# Patient Record
Sex: Female | Born: 1995 | Race: Black or African American | Hispanic: No | Marital: Single | Smoking: Former smoker
Health system: Southern US, Community
[De-identification: ages and names within clinical notes are randomized; demographics above are authoritative.]

## PROBLEM LIST (undated history)

## (undated) ENCOUNTER — Emergency Department (HOSPITAL_COMMUNITY): Payer: BLUE CROSS/BLUE SHIELD | Source: Home / Self Care

## (undated) DIAGNOSIS — T7840XA Allergy, unspecified, initial encounter: Secondary | ICD-10-CM

## (undated) DIAGNOSIS — N83209 Unspecified ovarian cyst, unspecified side: Secondary | ICD-10-CM

## (undated) DIAGNOSIS — G8929 Other chronic pain: Secondary | ICD-10-CM

## (undated) DIAGNOSIS — M549 Dorsalgia, unspecified: Secondary | ICD-10-CM

## (undated) DIAGNOSIS — B009 Herpesviral infection, unspecified: Secondary | ICD-10-CM

## (undated) DIAGNOSIS — G44009 Cluster headache syndrome, unspecified, not intractable: Secondary | ICD-10-CM

## (undated) HISTORY — DX: Cluster headache syndrome, unspecified, not intractable: G44.009

## (undated) HISTORY — DX: Unspecified ovarian cyst, unspecified side: N83.209

## (undated) HISTORY — PX: DG THUMB LEFT HAND: HXRAD1658

## (undated) HISTORY — PX: TONSILLECTOMY: SUR1361

## (undated) HISTORY — DX: Herpesviral infection, unspecified: B00.9

## (undated) HISTORY — PX: ADENOIDECTOMY: SUR15

## (undated) HISTORY — DX: Allergy, unspecified, initial encounter: T78.40XA

---
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
## 2017-01-03 ENCOUNTER — Encounter: Payer: Self-pay | Admitting: Family Medicine

## 2017-01-03 ENCOUNTER — Ambulatory Visit (INDEPENDENT_AMBULATORY_CARE_PROVIDER_SITE_OTHER): Payer: BLUE CROSS/BLUE SHIELD | Admitting: Family Medicine

## 2017-01-03 VITALS — BP 120/78 | HR 84 | Temp 98.3°F | Resp 16 | Ht 72.0 in | Wt 228.0 lb

## 2017-01-03 DIAGNOSIS — G43009 Migraine without aura, not intractable, without status migrainosus: Secondary | ICD-10-CM | POA: Diagnosis not present

## 2017-01-03 DIAGNOSIS — L700 Acne vulgaris: Secondary | ICD-10-CM

## 2017-01-03 DIAGNOSIS — L74510 Primary focal hyperhidrosis, axilla: Secondary | ICD-10-CM | POA: Diagnosis not present

## 2017-01-03 MED ORDER — ALUMINUM CHLORIDE 20 % EX SOLN: Freq: Every day | CUTANEOUS | 6 refills | Status: DC

## 2017-01-03 MED ORDER — CLINDAMYCIN PHOS-BENZOYL PEROX 1-5 % EX GEL: Freq: Two times a day (BID) | CUTANEOUS | 6 refills | Status: DC

## 2017-01-03 NOTE — Progress Notes (Signed)
Chief Complaint  Patient presents with  . Follow-up    2 month   Headaches improved Acne improved drysol works for sweating Wonders about Pap testing - advised can wait until 23 Never went to PT.  Back is not hurting currently  Patient Active Problem List   Diagnosis Date Noted  . Migraine headache without aura 11/28/2016  . Chronic back pain 11/28/2016  . 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 Acne vulgaris 11/28/2016    Outpatient Encounter Prescriptions as of 01/03/2017  Medication Sig  . aluminum chloride (DRYSOL) 20 % external solution Apply topically at bedtime.  Marland Kitchen aspirin-acetaminophen-caffeine (EXCEDRIN MIGRAINE) 250-250-65 MG tablet Take 1 tablet by mouth every 6 (six) hours as needed for headache.  . clindamycin-benzoyl peroxide (BENZACLIN) gel Apply topically 2 (two) times daily.  Marland Kitchen etonogestrel (NEXPLANON) 68 MG IMPL implant 1 each by Subdermal route once.  Marland Kitchen ibuprofen (ADVIL,MOTRIN) 800 MG tablet Take 1 tablet (800 mg total) by mouth every 8 (eight) hours as needed.  . rizatriptan (MAXALT) 10 MG tablet Take 10 mg by mouth as needed for migraine. May repeat in 2 hours if needed  . topiramate (TOPAMAX) 25 MG tablet Take 100 mg by mouth daily.   . [DISCONTINUED] aluminum chloride (DRYSOL) 20 % external solution Apply topically at bedtime.  . [DISCONTINUED] clindamycin-benzoyl peroxide (BENZACLIN) gel Apply topically 2 (two) times daily.   No facility-administered encounter medications on file as of 01/03/2017.     Allergies  Allergen Reactions  . Oxycodone Other (See Comments)    Dizziness     Review of Systems  Constitutional: Negative for activity change, appetite change and unexpected weight change.  HENT: Negative for congestion, dental problem, postnasal drip and rhinorrhea.   Eyes: Negative for redness and visual disturbance.  Respiratory: Negative for cough and shortness of breath.   Cardiovascular: Negative for chest pain, palpitations and leg  swelling.  Gastrointestinal: Negative for abdominal pain, constipation and diarrhea.  Genitourinary: Negative for difficulty urinating, frequency and menstrual problem.  Musculoskeletal: Negative for arthralgias and back pain.  Neurological: Negative for dizziness and headaches.  Psychiatric/Behavioral: Negative for dysphoric mood and sleep disturbance. The patient is not nervous/anxious.     BP 120/78 (BP Location: Right Arm, Patient Position: Sitting, Cuff Size: Normal)   Pulse 84   Temp 98.3 F (36.8 C) (Temporal)   Resp 16   Ht 6' (1.829 m)   Wt 228 lb 0.6 oz (103.4 kg)   SpO2 99%   BMI 30.93 kg/m   Physical Exam  Constitutional: She is oriented to person, place, and time. She appears well-developed and well-nourished.  HENT:  Head: Normocephalic and atraumatic.  Mouth/Throat: Oropharynx is clear and moist.  Eyes: Conjunctivae are normal. Pupils are equal, round, and reactive to light.  Neck: Normal range of motion. Neck supple. No thyromegaly present.  Cardiovascular: Normal rate, regular rhythm and normal heart sounds.   Pulmonary/Chest: Effort normal and breath sounds normal. No respiratory distress.  Musculoskeletal: Normal range of motion. She exhibits no edema.  Lymphadenopathy:    She has no cervical adenopathy.  Neurological: She is alert and oriented to person, place, and time.  Gait normal  Skin: Skin is warm and dry.  Mild acne face  Psychiatric: She has a normal mood and affect. Her behavior is normal. Thought content normal.  Nursing note and vitals reviewed.   ASSESSMENT/PLAN:  1. Migraine without aura and without status migrainosus, not intractable neurology  2. Hyperhidrosis of axilla controlled  3.  Acne vulgaris improved   Patient Instructions  Continue the dry sol and the face treatment Walk every day that you are able  Need last gardasil  See me yearly Call sooner for problems    Eustace MooreYvonne Sue Victor Granados, MD

## 2017-01-03 NOTE — Patient Instructions (Signed)
Continue the dry sol and the face treatment Walk every day that you are able  Need last gardasil  See me yearly Call sooner for problems

## 2017-01-14 ENCOUNTER — Telehealth: Payer: Self-pay

## 2017-01-14 NOTE — Telephone Encounter (Signed)
lmtcb

## 2017-01-14 NOTE — Telephone Encounter (Signed)
Patient needs a PT reff for after 7/23.  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  She is asking us to re-do a reff for after 7/23.  Please call her if you do not understand.  [phone]

## 2017-03-03 ENCOUNTER — Telehealth: Payer: Self-pay | Admitting: Family Medicine

## 2017-03-03 DIAGNOSIS — M549 Dorsalgia, unspecified: Principal | ICD-10-CM

## 2017-03-03 DIAGNOSIS — G8929 Other chronic pain: Secondary | ICD-10-CM

## 2017-03-03 NOTE — Telephone Encounter (Signed)
Patient requesting another referral for lower back PT.  She was never able to go because of school.  The other referral expired.  The PT office said she just needs to get a new one and they will be able to schedule her.  We have new number and fax for [facility]   Address: [address], [place], [place] [address]  Phone: [phone] Fax [phone]

## 2017-03-03 NOTE — Telephone Encounter (Signed)
Referral signed.

## 2017-04-04 ENCOUNTER — Telehealth: Payer: Self-pay | Admitting: Family Medicine

## 2017-04-04 NOTE — Telephone Encounter (Signed)
Patient states she tried physical therapy it did not work for her so she has been [facility]. She began to tell me what the chiropractor is telling her about her condition but I asked her to fill out a medical release for and send [place] the records for you to review.  She wanted you to know this situation.

## 2017-04-04 NOTE — Telephone Encounter (Signed)
noted 

## 2017-05-04 ENCOUNTER — Encounter (HOSPITAL_COMMUNITY): Payer: Self-pay | Admitting: *Deleted

## 2017-05-04 DIAGNOSIS — Z79899 Other long term (current) drug therapy: Secondary | ICD-10-CM | POA: Diagnosis not present

## 2017-05-04 DIAGNOSIS — N9089 Other specified noninflammatory disorders of vulva and perineum: Secondary | ICD-10-CM | POA: Insufficient documentation

## 2017-05-04 DIAGNOSIS — Z202 Contact with and (suspected) exposure to infections with a predominantly sexual mode of transmission: Secondary | ICD-10-CM | POA: Insufficient documentation

## 2017-05-04 NOTE — ED Triage Notes (Signed)
Pt c/o abscess to vaginal area x 3 days and burns with urination

## 2017-05-05 ENCOUNTER — Emergency Department (HOSPITAL_COMMUNITY)
Admission: EM | Admit: 2017-05-05 | Discharge: 2017-05-05 | Disposition: A | Discharge status: Home / Self Care | Payer: BLUE CROSS/BLUE SHIELD | Attending: Emergency Medicine | Admitting: Emergency Medicine

## 2017-05-05 DIAGNOSIS — N9089 Other specified noninflammatory disorders of vulva and perineum: Secondary | ICD-10-CM | POA: Diagnosis not present

## 2017-05-05 DIAGNOSIS — Z202 Contact with and (suspected) exposure to infections with a predominantly sexual mode of transmission: Secondary | ICD-10-CM

## 2017-05-05 LAB — URINALYSIS, ROUTINE W REFLEX MICROSCOPIC
BILIRUBIN URINE: NEGATIVE
Glucose, UA: NEGATIVE mg/dL
Hgb urine dipstick: NEGATIVE
KETONES UR: NEGATIVE mg/dL
Nitrite: NEGATIVE
PROTEIN: NEGATIVE mg/dL
Specific Gravity, Urine: 1.016 (ref 1.005–1.030)
pH: 7 (ref 5.0–8.0)

## 2017-05-05 LAB — POC URINE PREG, ED: Preg Test, Ur: NEGATIVE

## 2017-05-05 MED ORDER — AZITHROMYCIN 250 MG PO TABS
1000.0000 mg | ORAL_TABLET | Freq: Once | ORAL | Status: AC
  Administered 2017-05-05: 1000 mg via ORAL
  Filled 2017-05-05: qty 4

## 2017-05-05 MED ORDER — VALACYCLOVIR HCL 1 G PO TABS: 1000.0000 mg | ORAL_TABLET | Freq: Two times a day (BID) | ORAL | 0 refills | Status: DC

## 2017-05-05 MED ORDER — CEFTRIAXONE SODIUM 250 MG IJ SOLR
250.0000 mg | Freq: Once | INTRAMUSCULAR | Status: AC
  Administered 2017-05-05: 250 mg via INTRAMUSCULAR
  Filled 2017-05-05: qty 250

## 2017-05-05 MED ORDER — LIDOCAINE HCL (PF) 1 % IJ SOLN
INTRAMUSCULAR | Status: AC
  Administered 2017-05-05: 01:00:00
  Filled 2017-05-05: qty 2

## 2017-05-05 NOTE — Discharge Instructions (Signed)
Warm sitz baths may help.  Ibuprofen 600 mg every 6-8 hrs if needed for pain.  You will be contacted for any positive test results.  Call [facility] to arrange a follow-up appt.

## 2017-05-06 LAB — HIV ANTIBODY (ROUTINE TESTING W REFLEX): HIV Screen 4th Generation wRfx: NONREACTIVE

## 2017-05-06 LAB — RPR: RPR Ser Ql: NONREACTIVE

## 2017-05-06 NOTE — ED Provider Notes (Signed)
[facility] EMERGENCY DEPARTMENT Provider Note   CSN: [id number] Arrival date & time: 05/04/17  2325     History   Chief Complaint Chief Complaint  Patient presents with  . Abscess    HPI Lauren Arnold is a 21 y.o. female.  HPI  Lauren L Hogeland is a 21 y.o. female who presents to the Emergency Department complaining of pain and a "bump" to the base of the vagina and right lower labia.  Symptoms associated with burning with urination and palpation and present for 3 days.  Admits to unprotected intercourse and oral sex recently.   Denies abdominal pain, fever, hematuria, pelvic pain.    Past Medical History:  Diagnosis Date  . Allergy   . Chronic back pain     Patient Active Problem List   Diagnosis Date Noted  . Migraine headache without aura 11/28/2016  . Chronic back pain 11/28/2016  . 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 Acne vulgaris 11/28/2016    Past Surgical History:  Procedure Laterality Date  . ADENOIDECTOMY    . DG THUMB LEFT HAND    . TONSILLECTOMY      OB History    No data available       Home Medications    Prior to Admission medications   Medication Sig Start Date End Date Taking? Authorizing Provider  aluminum chloride (DRYSOL) 20 % external solution Apply topically at bedtime. 01/03/17   Eustace Moore, MD  aspirin-acetaminophen-caffeine (EXCEDRIN MIGRAINE) [phone] MG tablet Take 1 tablet by mouth every 6 (six) hours as needed for headache.    [provider]  clindamycin-benzoyl peroxide (BENZACLIN) gel Apply topically 2 (two) times daily. 01/03/17   Eustace Moore, MD  etonogestrel (NEXPLANON) 68 MG IMPL implant 1 each by Subdermal route once.    [provider]  ibuprofen (ADVIL,MOTRIN) 800 MG tablet Take 1 tablet (800 mg total) by mouth every 8 (eight) hours as needed. 11/28/16   Eustace Moore, MD  rizatriptan (MAXALT) 10 MG tablet Take 10 mg by mouth as needed for migraine. May repeat in 2 hours  if needed    [provider]  topiramate (TOPAMAX) 25 MG tablet Take 100 mg by mouth daily.     [provider]  valACYclovir (VALTREX) 1000 MG tablet Take 1 tablet (1,000 mg total) by mouth 2 (two) times daily. 05/05/17   Pauline Aus, PA-C    Family History Family History  Problem Relation Age of Onset  . Cancer Maternal Aunt        aunt  . Cancer Maternal Grandfather     Social History Social History  Substance Use Topics  . Smoking status: Never Smoker  . Smokeless tobacco: Never Used  . Alcohol use No     Allergies   Oxycodone   Review of Systems Review of Systems  Constitutional: Negative for chills and fever.  HENT: Negative for sore throat and trouble swallowing.   Respiratory: Negative for shortness of breath.   Cardiovascular: Negative for chest pain.  Gastrointestinal: Negative for abdominal pain, nausea and vomiting.  Genitourinary: Positive for dysuria, genital sores and vaginal pain. Negative for menstrual problem, vaginal bleeding and vaginal discharge.  Musculoskeletal: Negative for arthralgias and myalgias.  Skin: Negative for rash.  Neurological: Negative for dizziness, weakness, numbness and headaches.     Physical Exam Updated Vital Signs BP (!) 150/88 (BP Location: Right Arm)   Pulse (!) 103   Temp 99 F (37.2 C) (Oral)  Resp 18   Ht 6' (1.829 m)   Wt 106.6 kg (235 lb)   SpO2 100%   BMI 31.87 kg/m   Physical Exam  Constitutional: She is oriented to person, place, and time. She appears well-developed and well-nourished. No distress.  HENT:  Head: Atraumatic.  Mouth/Throat: Oropharynx is clear and moist.  Neck: Normal range of motion.  Cardiovascular: Normal rate, normal heart sounds and intact distal pulses.   No murmur heard. Pulmonary/Chest: Effort normal and breath sounds normal. No respiratory distress.  Abdominal: Soft. She exhibits no distension and no mass. There is no tenderness. There is no guarding.    Genitourinary:  Genitourinary Comments: Exam chaperoned by nursing. Ulcerations to the lower right labia minora and at base of the vaginal opening.    Musculoskeletal: Normal range of motion.  Lymphadenopathy:    She has no cervical adenopathy.  Neurological: She is alert and oriented to person, place, and time. No sensory deficit.  Skin: Skin is warm. Capillary refill takes less than 2 seconds. No rash noted.  Nursing note and vitals reviewed.    ED Treatments / Results  Labs (all labs ordered are listed, but only abnormal results are displayed) Labs Reviewed  URINALYSIS, ROUTINE W REFLEX MICROSCOPIC - Abnormal; Notable for the following:       Result Value   APPearance HAZY (*)    Leukocytes, UA LARGE (*)    Bacteria, UA RARE (*)    Squamous Epithelial / LPF 6-30 (*)    All other components within normal limits  URINE CULTURE  HSV CULTURE AND TYPING  RPR  HIV ANTIBODY (ROUTINE TESTING)  POC URINE PREG, ED    EKG  EKG Interpretation None       Radiology No results found.  Procedures Procedures (including critical care time)  Medications Ordered in ED Medications  cefTRIAXone (ROCEPHIN) injection 250 mg (250 mg Intramuscular Given 05/05/17 0118)  azithromycin (ZITHROMAX) tablet 1,000 mg (1,000 mg Oral Given 05/05/17 0118)  lidocaine (PF) (XYLOCAINE) 1 % injection (  Given 05/05/17 0118)     Initial Impression / Assessment and Plan / ED Course  I have reviewed the triage vital signs and the nursing notes.  Pertinent labs & imaging results that were available during my care of the patient were reviewed by me and considered in my medical decision making (see chart for details).     Pt well appearing.  Abdomen soft, NT.  No concerning sx's for PID or TOA.  Cultures pending.  Will tx with IM rocephin and po zithromax here, and rx for valtrex.  Pt counseled on safe sex practices and agrees to f/u with GYN.    Final Clinical Impressions(s) / ED Diagnoses    Final diagnoses:  Possible exposure to STD    New Prescriptions Discharge Medication List as of 05/05/2017  1:29 AM    START taking these medications   Details  valACYclovir (VALTREX) 1000 MG tablet Take 1 tablet (1,000 mg total) by mouth 2 (two) times daily., Starting Mon 05/05/2017, Print         [place], [place], PA-C 05/06/17 1941    Devoria AlbeKnapp, Iva, MD 05/07/17 2300

## 2017-05-07 ENCOUNTER — Ambulatory Visit (INDEPENDENT_AMBULATORY_CARE_PROVIDER_SITE_OTHER): Payer: BLUE CROSS/BLUE SHIELD | Admitting: Family Medicine

## 2017-05-07 ENCOUNTER — Encounter: Payer: Self-pay | Admitting: Family Medicine

## 2017-05-07 VITALS — BP 110/72 | HR 72 | Temp 98.9°F | Resp 16 | Ht 72.0 in | Wt 235.1 lb

## 2017-05-07 DIAGNOSIS — A6004 Herpesviral vulvovaginitis: Secondary | ICD-10-CM

## 2017-05-07 DIAGNOSIS — Z3009 Encounter for other general counseling and advice on contraception: Secondary | ICD-10-CM | POA: Diagnosis not present

## 2017-05-07 DIAGNOSIS — L700 Acne vulgaris: Secondary | ICD-10-CM | POA: Diagnosis not present

## 2017-05-07 HISTORY — DX: Herpesviral vulvovaginitis: A60.04

## 2017-05-07 LAB — HSV CULTURE AND TYPING

## 2017-05-07 LAB — URINE CULTURE: Culture: 20000 — AB

## 2017-05-07 MED ORDER — MAGIC MOUTHWASH W/LIDOCAINE: 5.0000 mL | Freq: Four times a day (QID) | ORAL | 0 refills | Status: DC | PRN

## 2017-05-07 MED ORDER — CLINDAMYCIN PHOS-BENZOYL PEROX 1-5 % EX GEL: Freq: Two times a day (BID) | CUTANEOUS | 6 refills | Status: DC

## 2017-05-07 MED ORDER — LIDOCAINE 5 % EX OINT: 1.0000 "application " | TOPICAL_OINTMENT | CUTANEOUS | 0 refills | Status: DC | PRN

## 2017-05-07 NOTE — Progress Notes (Signed)
Chief Complaint  Patient presents with  . Follow-up    ED   Here for ER follow up Primary outbreak of painful vesicles on genitals - spreading and painful. Went to ER and cultures pending aslo has pain with swallowing due to sore throat No fever or malaise Had still urinate Is here with sig other - boyfriend of years He does not have genital lesions but has had cold sores  Patient Active Problem List   Diagnosis Date Noted  . 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 Migraine headache without aura 11/28/2016  . Chronic back pain 11/28/2016  . 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 Acne vulgaris 11/28/2016    Outpatient Encounter Prescriptions as of 05/07/2017  Medication Sig  . aluminum chloride (DRYSOL) 20 % external solution Apply topically at bedtime.  Marland Kitchen aspirin-acetaminophen-caffeine (EXCEDRIN MIGRAINE) 250-250-65 MG tablet Take 1 tablet by mouth every 6 (six) hours as needed for headache.  . clindamycin-benzoyl peroxide (BENZACLIN) gel Apply topically 2 (two) times daily.  Marland Kitchen etonogestrel (NEXPLANON) 68 MG IMPL implant 1 each by Subdermal route once.  Marland Kitchen ibuprofen (ADVIL,MOTRIN) 800 MG tablet Take 1 tablet (800 mg total) by mouth every 8 (eight) hours as needed.  . rizatriptan (MAXALT) 10 MG tablet Take 10 mg by mouth as needed for migraine. May repeat in 2 hours if needed  . topiramate (TOPAMAX) 25 MG tablet Take 100 mg by mouth daily.   . valACYclovir (VALTREX) 1000 MG tablet Take 1 tablet (1,000 mg total) by mouth 2 (two) times daily.  . [DISCONTINUED] clindamycin-benzoyl peroxide (BENZACLIN) gel Apply topically 2 (two) times daily.  Marland Kitchen lidocaine (XYLOCAINE) 5 % ointment Apply 1 application topically as needed.  . magic mouthwash w/lidocaine SOLN Take 5 mLs by mouth 4 (four) times daily as needed for mouth pain.   No facility-administered encounter medications on file as of 05/07/2017.     Allergies  Allergen Reactions  . Oxycodone Other (See  Comments)    Dizziness     Review of Systems  Constitutional: Negative for activity change, appetite change and unexpected weight change.  HENT: Positive for sore throat. Negative for congestion, dental problem, postnasal drip and rhinorrhea.   Eyes: Negative for redness and visual disturbance.  Respiratory: Negative for cough and shortness of breath.   Cardiovascular: Negative for chest pain, palpitations and leg swelling.  Gastrointestinal: Negative for abdominal pain, constipation and diarrhea.  Genitourinary: Positive for genital sores. Negative for difficulty urinating, frequency and menstrual problem.  Musculoskeletal: Negative for arthralgias and back pain.  Neurological: Negative for dizziness and headaches.  Psychiatric/Behavioral: Negative for dysphoric mood and sleep disturbance. The patient is not nervous/anxious.     BP 110/72 (BP Location: Right Arm, Patient Position: Sitting, Cuff Size: Large)   Pulse 72   Temp 98.9 F (37.2 C) (Temporal)   Resp 16   Ht 6' (1.829 m)   Wt 235 lb 1.3 oz (106.6 kg)   BMI 31.88 kg/m   Physical Exam  Constitutional: She appears well-developed and well-nourished. No distress.  HENT:  Head: Normocephalic and atraumatic.  Right Ear: External ear normal.  Left Ear: External ear normal.  Nose: Nose normal.  Mouth/Throat: Oropharynx is clear and moist.  2 shallow small ulcerations seen on soft palate, mild erythema posterior pharynx no exudate  Eyes: Pupils are equal, round, and reactive to light.  Neck: Normal range of motion.  Cardiovascular: Normal rate, regular rhythm and normal heart sounds.   Pulmonary/Chest: Effort normal and breath  sounds normal.  Genitourinary:  Genitourinary Comments: Patient declines genital exam  Lymphadenopathy:    She has cervical adenopathy.  Skin: Skin is warm and dry.  Psychiatric: She has a normal mood and affect. Her behavior is normal.    ASSESSMENT/PLAN:  1. Primary vulvovaginal herpes  simplex infection I explained to the patient by history is likely that she has herpes.  She became very tearful.  I did give her written information about herpes, transmission, and what to expect.  We discussed safe sex.  2. Acne vulgaris Refill clindamycin  3. General counselling and advice on contraception Patient request - Ambulatory referral to Gynecology   Patient Instructions  I have ordered medicine for the pain  I have refilled your face medicine  I will call you with your test result  [facility]  Call for problems      Eustace MooreYvonne Sue Nelson, MD

## 2017-05-07 NOTE — Patient Instructions (Addendum)
I have ordered medicine for the pain  I have refilled your face medicine  I will call you with your test result  [facility] place [facility]  Call for problems

## 2017-05-08 ENCOUNTER — Telehealth: Payer: Self-pay | Admitting: *Deleted

## 2017-05-08 ENCOUNTER — Telehealth: Payer: Self-pay | Admitting: Family Medicine

## 2017-05-08 ENCOUNTER — Encounter: Payer: Self-pay | Admitting: Family Medicine

## 2017-05-08 NOTE — Telephone Encounter (Signed)
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  This is the cold sore virus, but can cause genital symptoms as well.  See if she has specific questions.  I gave her written info.

## 2017-05-08 NOTE — Telephone Encounter (Signed)
Patient wants to discuss lab test results from ER, wants a better understanding of the results Cb#: [phone]

## 2017-05-08 NOTE — Telephone Encounter (Signed)
Post ED Visit - Positive Culture Follow-up  Culture report reviewed by antimicrobial stewardship pharmacist:  []  Enzo BiNathan Batchelder, Pharm.D. []  Celedonio MiyamotoJeremy Frens, [address]Pharm[address]D., BCPS AQ-ID [x]  Garvin FilaMike Maccia, Pharm.D., BCPS []  Georgina PillionElizabeth Martin, Pharm.D., BCPS []  ArenaMinh Pham, [address]Pharm[address]D., BCPS, AAHIVP []  Estella HuskMichelle Turner, Pharm.D., BCPS, AAHIVP []  Lysle Pearlachel Rumbarger, PharmD, BCPS []  Casilda Carlsaylor Stone, PharmD, BCPS []  Pollyann SamplesAndy Johnston, PharmD, BCPS  Positive urine culture Seen by PCP and no further patient follow-up is required at this time.  Virl AxeRobertson, [place] [facility] 05/08/2017, 10:55 AM

## 2017-05-08 NOTE — Telephone Encounter (Signed)
Discussed result with Damiyah

## 2017-05-15 ENCOUNTER — Other Ambulatory Visit: Payer: Self-pay | Admitting: Family Medicine

## 2017-05-15 ENCOUNTER — Telehealth: Payer: Self-pay | Admitting: *Deleted

## 2017-05-15 MED ORDER — VALACYCLOVIR HCL 1 G PO TABS: 1000.0000 mg | ORAL_TABLET | Freq: Two times a day (BID) | ORAL | 0 refills | Status: DC

## 2017-05-15 NOTE — Telephone Encounter (Signed)
valtrex sent to pharmacy.  If cannot get the Rx mouthwash can use OTC chloraseptic spray for any mouth or throat pain.

## 2017-05-15 NOTE — Telephone Encounter (Signed)
Patient called stating she needs a refill on valacyclovir, patient states she could not get the mouthwash due to insurance not covering.

## 2017-05-15 NOTE — Telephone Encounter (Signed)
Patient informed of message below, verbalized understanding.  

## 2017-05-22 ENCOUNTER — Ambulatory Visit: Payer: Self-pay | Admitting: Family Medicine

## 2017-05-23 ENCOUNTER — Ambulatory Visit (INDEPENDENT_AMBULATORY_CARE_PROVIDER_SITE_OTHER): Payer: BLUE CROSS/BLUE SHIELD | Admitting: Adult Health

## 2017-05-23 ENCOUNTER — Encounter: Payer: Self-pay | Admitting: Adult Health

## 2017-05-23 VITALS — BP 102/60 | HR 76 | Ht 72.0 in | Wt 240.0 lb

## 2017-05-23 DIAGNOSIS — Z975 Presence of (intrauterine) contraceptive device: Secondary | ICD-10-CM | POA: Diagnosis not present

## 2017-05-23 DIAGNOSIS — B009 Herpesviral infection, unspecified: Secondary | ICD-10-CM

## 2017-05-23 DIAGNOSIS — Z113 Encounter for screening for infections with a predominantly sexual mode of transmission: Secondary | ICD-10-CM | POA: Diagnosis not present

## 2017-05-23 DIAGNOSIS — Z01419 Encounter for gynecological examination (general) (routine) without abnormal findings: Secondary | ICD-10-CM | POA: Diagnosis not present

## 2017-05-23 DIAGNOSIS — Z3009 Encounter for other general counseling and advice on contraception: Secondary | ICD-10-CM | POA: Insufficient documentation

## 2017-05-23 HISTORY — DX: Encounter for other general counseling and advice on contraception: Z30.09

## 2017-05-23 MED ORDER — AMPICILLIN 500 MG PO CAPS: 500.0000 mg | ORAL_CAPSULE | Freq: Four times a day (QID) | ORAL | 0 refills | Status: DC

## 2017-05-23 NOTE — Progress Notes (Signed)
Patient ID: Lauren Arnold, female   DOB: 11/14/1995, 21 y.o.   MRN: [id number] History of Present Illness:  Lauren Arnold is a 21 year old black female in for well woman gyn exam, She recently had HSV 1.Has nexplanon in right arm, was placed October 2017.Had negative HIV and RPR in ER 05/05/17.+GBS in urine.  PCP is Dr Delton SeeNelson.   Current Medications, Allergies, Past Medical History, Past Surgical History, Family History and Social History were [facility].     Review of Systems:  Patient denies any headaches, hearing loss, fatigue, blurred vision, shortness of breath, chest pain, abdominal pain, problems with bowel movements, urination, or intercourse. No joint pain or mood swings.   Physical Exam:BP 102/60 (BP Location: Left Arm, Patient Position: Sitting, Cuff Size: Large)   Pulse 76   Ht 6' (1.829 m)   Wt 240 lb (108.9 kg)   BMI 32.55 kg/m  General:  Well developed, well nourished, no acute distress Skin:  Warm and dry Neck:  Midline trachea, normal thyroid, good ROM, no lymphadenopathy Lungs; Clear to auscultation bilaterally Breast:  No dominant palpable mass, retraction, or nipple discharge Cardiovascular: Regular rate and rhythm Abdomen:  Soft, non tender, no hepatosplenomegaly Pelvic:  External genitalia is normal in appearance, no lesions.  The vagina is normal in appearance. Urethra has no lesions or masses. The cervix is tiny and smooth.  Uterus is felt to be normal size, shape, and contour.  No adnexal masses or tenderness noted.Bladder is non tender, no masses felt.GC/CHL obtained.  Extremities/musculoskeletal:  No swelling or varicosities noted, no clubbing or cyanosis Psych:  No mood changes, alert and cooperative,seems happy PHQ 9 score 5,denies being suicidal, has seen psychiatrist and has rx but not started it yet.  Impression: 1. Encounter for well woman exam with routine gynecological exam   2. Screening examination for STD  (sexually transmitted disease)   3. Nexplanon in place       Plan: Rx ampicillin 500 mg 1 qid for 7 days GC/CHL sent Physical in 1 year Pap at 23 Review handout on herpes

## 2017-05-25 LAB — GC/CHLAMYDIA PROBE AMP
CHLAMYDIA, DNA PROBE: NEGATIVE
NEISSERIA GONORRHOEAE BY PCR: NEGATIVE

## 2017-07-21 ENCOUNTER — Telehealth: Payer: Self-pay | Admitting: Family Medicine

## 2017-07-21 NOTE — Telephone Encounter (Signed)
Cant get in to see Neuro Dr, and the Migraines are back.. And the Tramadol is making her sick, and does not work. What can she [phone],,,,[phone], about to go to class from 3 to 5 so call tomorrow

## 2017-07-22 NOTE — Telephone Encounter (Signed)
ibu 800 TID prn with food

## 2017-07-23 MED ORDER — IBUPROFEN 800 MG PO TABS: 800.0000 mg | ORAL_TABLET | Freq: Three times a day (TID) | ORAL | 0 refills | Status: DC | PRN

## 2017-07-23 NOTE — Telephone Encounter (Signed)
Done, left message for bri kell

## 2017-09-22 ENCOUNTER — Encounter: Payer: Self-pay | Admitting: Family Medicine

## 2017-12-31 IMAGING — CT CT HEAD W/O CM
3 series · 16 of 47 positions shown, 19 images · non-contrast
Comparison: None.

CLINICAL DATA: Daily headaches for month, photophobia.

EXAM:
CT HEAD WITHOUT CONTRAST
TECHNIQUE: Contiguous axial images were obtained from the base of the skull
through the vertex without intravenous contrast.

[Series 2: head wo · axial · 0.43mm/px · z∈[+117,+247]mm · 10 of 32 slices shown, 13 images]
[im 3/32  brain]
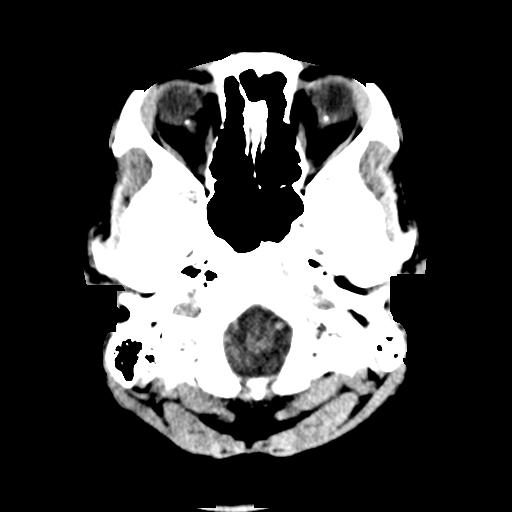
[im 3/32  bone]
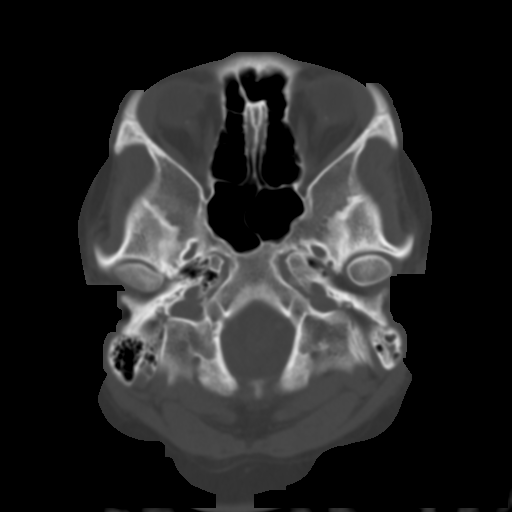
[im 6/32  brain]
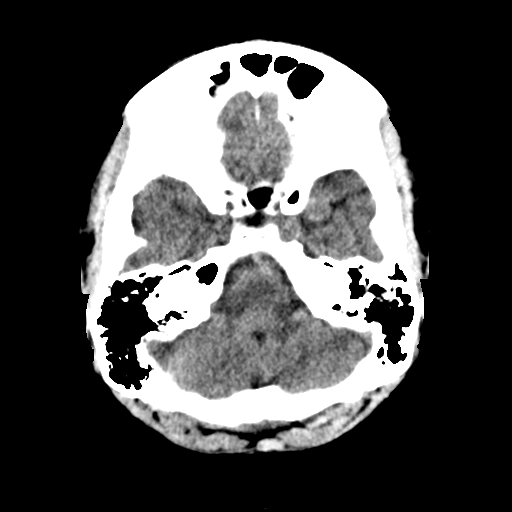
[im 9/32  brain]
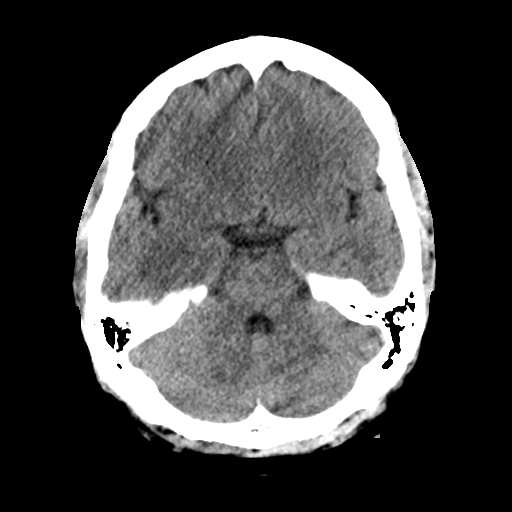
[im 11/32  brain]
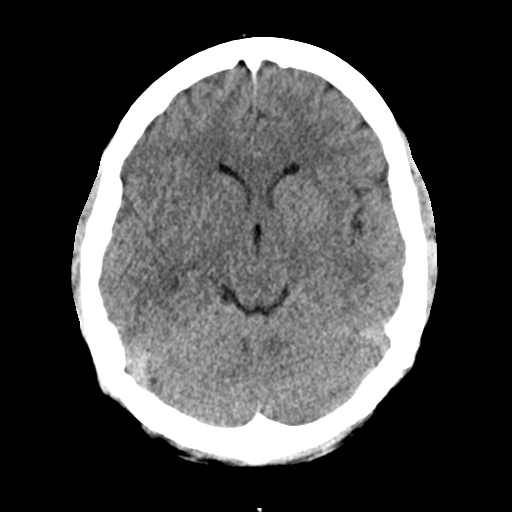
[im 14/32  brain]
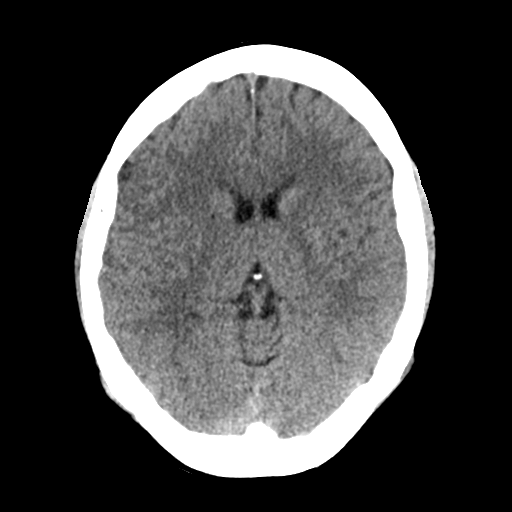
[im 14/32  bone]
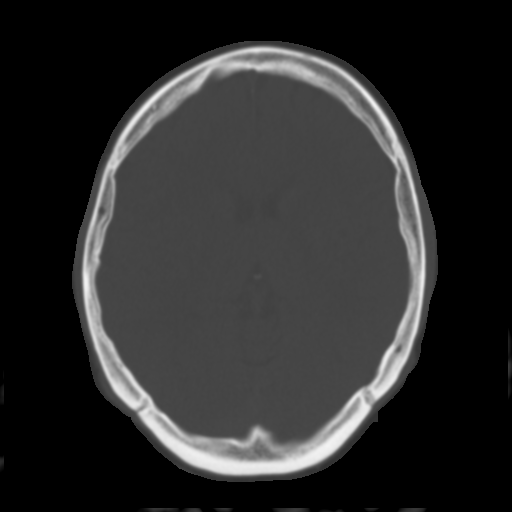
[im 18/32  brain]
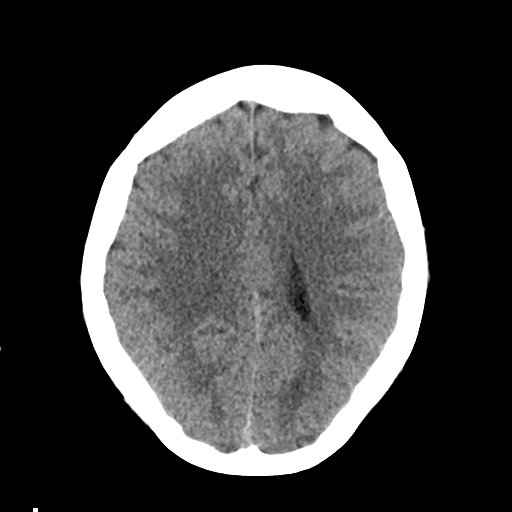
[im 21/32  brain]
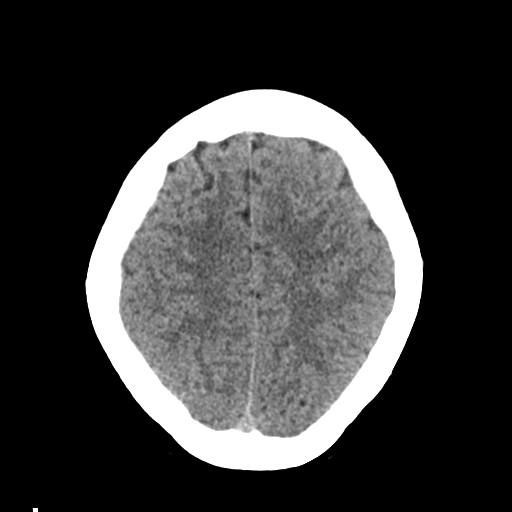
[im 24/32  brain]
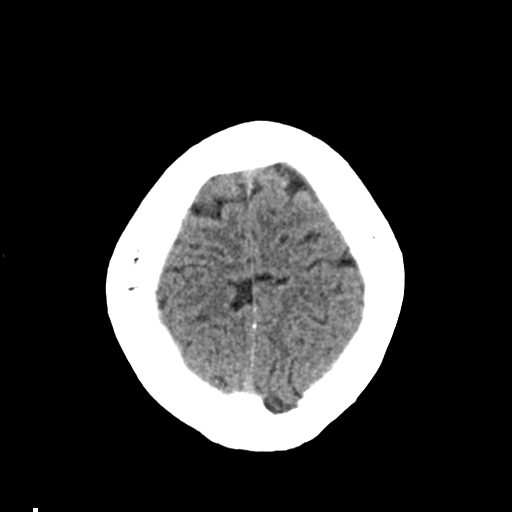
[im 26/32  brain]
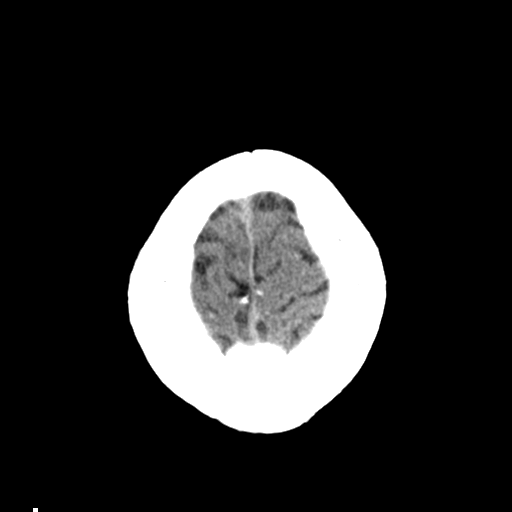
[im 26/32  bone]
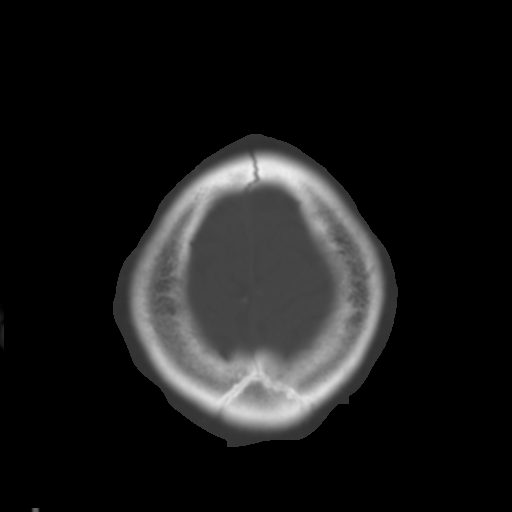
[im 29/32  brain]
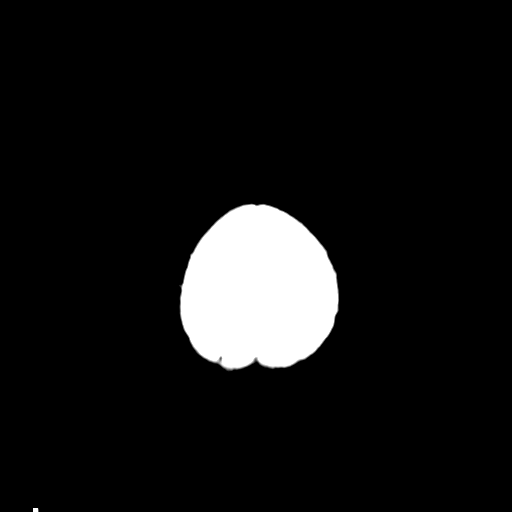

[Series 4: coronal soft tissue · coronal · 0.34mm/px · 3 of 71 slices shown]
[im 24/71  brain]
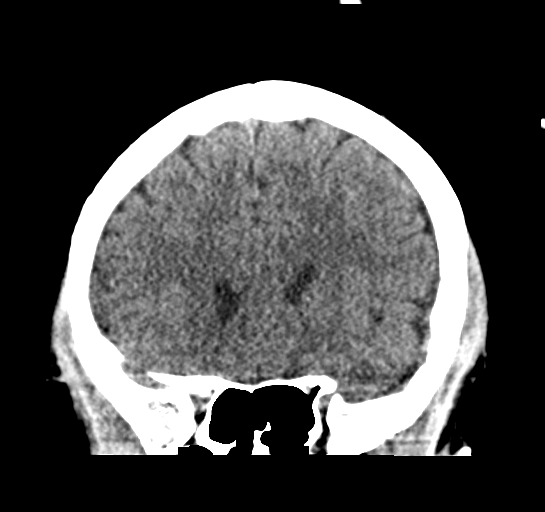
[im 32/71  brain]
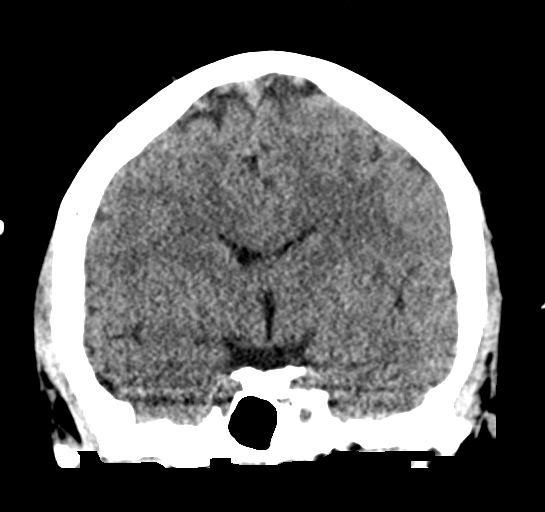
[im 39/71  brain]
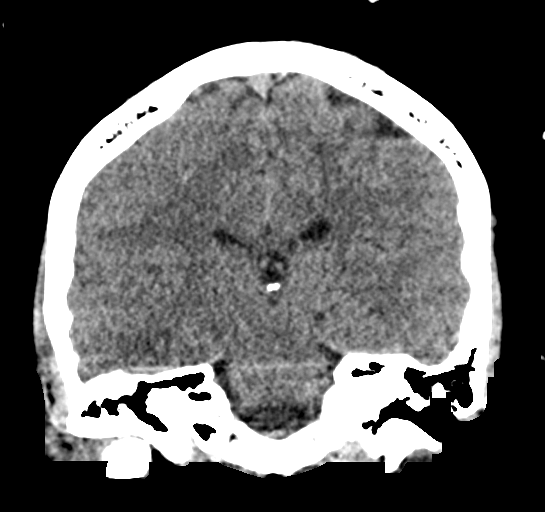

[Series 5: sagittal soft tissue · sagittal · 0.32mm/px · 3 of 58 slices shown]
[im 20/58  brain]
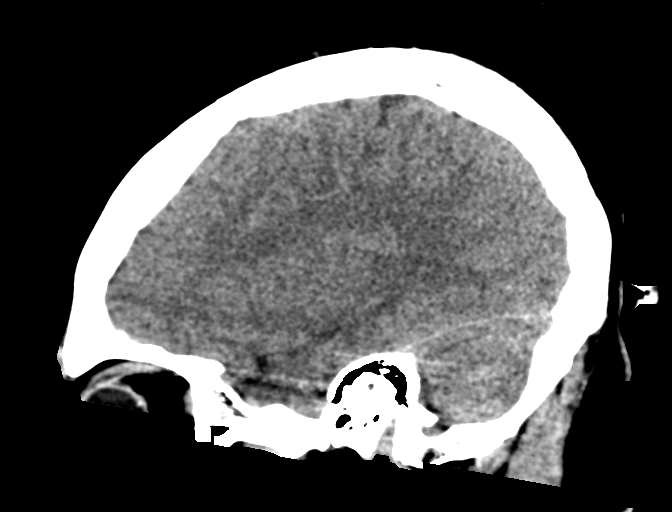
[im 29/58  brain]
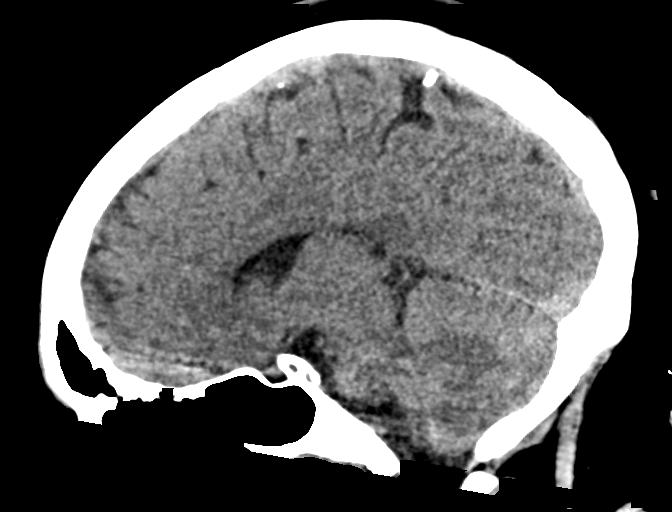
[im 39/58  brain]
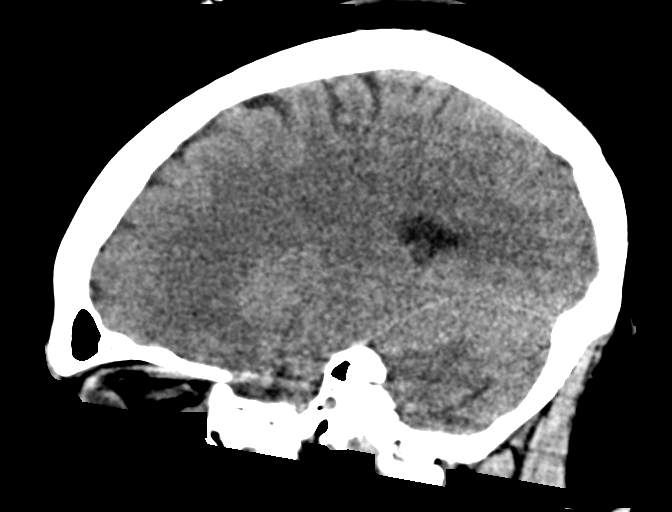

[16 of 47 positions shown; findings below may reference images not displayed]

FINDINGS: BRAIN: No intraparenchymal hemorrhage, mass effect nor midline
shift. The ventricles and sulci are normal. No acute large vascular
territory infarcts. No abnormal extra-axial fluid collections. Basal
cisterns are patent.

VASCULAR: Unremarkable.

SKULL/SOFT TISSUES: No skull fracture. No significant soft tissue
swelling.

ORBITS/SINUSES: The included ocular globes and orbital contents are
normal.The mastoid aircells and included paranasal sinuses are
well-aerated.

OTHER: None.
IMPRESSION: Normal CT HEAD.

## 2019-01-14 ENCOUNTER — Other Ambulatory Visit (HOSPITAL_COMMUNITY)
Admission: RE | Admit: 2019-01-14 | Discharge: 2019-01-14 | Disposition: A | Discharge status: Home / Self Care | Payer: BC Managed Care – PPO | Source: Ambulatory Visit | Attending: Family Medicine | Admitting: Family Medicine

## 2019-01-14 ENCOUNTER — Encounter: Payer: Self-pay | Admitting: Family Medicine

## 2019-01-14 ENCOUNTER — Ambulatory Visit (INDEPENDENT_AMBULATORY_CARE_PROVIDER_SITE_OTHER): Payer: BC Managed Care – PPO | Admitting: Family Medicine

## 2019-01-14 ENCOUNTER — Ambulatory Visit: Payer: BLUE CROSS/BLUE SHIELD | Admitting: Family Medicine

## 2019-01-14 ENCOUNTER — Other Ambulatory Visit: Payer: Self-pay

## 2019-01-14 VITALS — BP 106/64 | HR 68 | Temp 98.9°F | Resp 12 | Ht 72.0 in | Wt 212.1 lb

## 2019-01-14 DIAGNOSIS — Z114 Encounter for screening for human immunodeficiency virus [HIV]: Secondary | ICD-10-CM | POA: Diagnosis not present

## 2019-01-14 DIAGNOSIS — Z708 Other sex counseling: Secondary | ICD-10-CM | POA: Insufficient documentation

## 2019-01-14 DIAGNOSIS — Z7689 Persons encountering health services in other specified circumstances: Secondary | ICD-10-CM

## 2019-01-14 DIAGNOSIS — G43009 Migraine without aura, not intractable, without status migrainosus: Secondary | ICD-10-CM | POA: Diagnosis not present

## 2019-01-14 DIAGNOSIS — Z975 Presence of (intrauterine) contraceptive device: Secondary | ICD-10-CM

## 2019-01-14 DIAGNOSIS — Z1159 Encounter for screening for other viral diseases: Secondary | ICD-10-CM

## 2019-01-14 NOTE — Patient Instructions (Addendum)
   [place]  Thank you for coming into the office today. I appreciate the opportunity to provide you with the care for your health and wellness. Today we discussed: overall health  FOLLOW UP: 1 year for annual without pap or as needed  Get labs today. We will let you know results from labs once they come back.  Please let us know if you need referral back to [facility].  Emmit Alexanders with school!!!   Please continue to practice social distancing to keep you, your family, and our community safe.  If you must go out, please wear a Mask and practice good handwashing.  [place] YOUR HANDS WELL AND FREQUENTLY. AVOID TOUCHING YOUR FACE, UNLESS YOUR HANDS ARE FRESHLY WASHED.  GET FRESH AIR DAILY. STAY HYDRATED WITH WATER.   It was a pleasure to see you and I look forward to continuing to work together on your health and well-being. Please do not hesitate to call the office if you need care or have questions about your care.  Have a wonderful day and week.  With Gratitude,  Cherly Beach, DNP, AGNP-BC

## 2019-01-14 NOTE — Progress Notes (Signed)
Subjective:     Patient ID: Lauren Arnold, female   DOB: 03-01-1996, 23 y.o.   MRN: [id number]  Lauren Arnold presents for New Patient (Initial Visit) (establish care)  Lauren Arnold is a 23-year female patient who presents to establish care with me.  Was previously seen by Dr. Meda Coffee in this practice.  Has a significant history of cluster migraines that she gets every year or 2.  Is seen by neurologist.  Has a Nexplanon implanted.  Has started having periods and cramps questioning whether or not needs to be removed early.  Has been [facility] by [facility] before we will be following up with them.  Also would like to have STD testing.  Reports that she does not feel that she has had any exposure.  But has been with the same individual.  But would just like to have some testing as she has been positive for recent lites in the past.  And would like to follow-up on this.  Overall she is feeling good today in the office.  She reports some mild diarrhea possibly secondary to all the cramps that she has been experiencing.  She denies that she can be pregnant at this time.  Has not had sexual intercourse in the last 2 months.  Socially patient denies use of tobacco and alcohol or illicit drugs.  Sexually active uses Nexplanon implant.  Lives with mother and sister.  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 Reports eating a diet low in veggies, but tries to avoid fast food and candy. Does not drink water usually, rather likes juices. Does not drink caffeine or sodas.  Today patient denies signs and symptoms of COVID 19 infection including fever, chills, cough, shortness of breath, and headache.  Past Medical, Surgical, Social History, Allergies, and Medications have been Reviewed.   Past Medical History:  Diagnosis Date  . Allergy   . Chronic back pain   . 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 ADENOIDECTOMY    . DG THUMB LEFT HAND    . TONSILLECTOMY     Social History   Socioeconomic History  . Marital status: Single    Spouse name: Not on file  . Number of children: Not on file  . Years of education: Not on file  . Highest education level: Associate degree: occupational, Hotel manager, or vocational program  Occupational History  . Occupation: Ship broker  Social Needs  . Financial resource strain: Not hard at all  . Food insecurity    Worry: Never true    Inability: Never true  . Transportation needs    Medical: No    Non-medical: No  Tobacco Use  . Smoking status: Never Smoker  . Smokeless tobacco: Never Used  Substance and Sexual Activity  . Alcohol use: No  . Drug use: No  . Sexual activity: Yes    Birth control/protection: Implant  Lifestyle  . Physical activity    Days per week: 7 days    Minutes per session: 40 min  . Stress: Not on file  Relationships  . Social connections    Talks on phone: More than three times a week    Gets together: Once a week    Attends religious service: More than 4 times per year    Active member of club or organization: No    Attends  meetings of clubs or organizations: Never    Relationship status: Never married  . Intimate partner violence    Fear of current or ex partner: No    Emotionally abused: No    Physically abused: No    Forced sexual activity: No  Other Topics Concern  . 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    Outpatient Encounter Medications as  of 01/14/2019  Medication Sig  . aluminum chloride (DRYSOL) 20 % external solution Apply topically at bedtime.  . butalbital-acetaminophen-caffeine (FIORICET) 50-325-40 MG tablet Take 1 tablet by mouth as directed.  . etonogestrel (NEXPLANON) 68 MG IMPL implant 1 each by Subdermal route once.  Marland Kitchen. ibuprofen (ADVIL,MOTRIN) 800 MG tablet Take 1 tablet (800 mg total) by mouth every 8 (eight) hours as needed.  Marland Kitchen. TROKENDI XR 50 MG CP24 Take 50 mg by mouth at bedtime.  . [DISCONTINUED] ampicillin (PRINCIPEN) 500 MG capsule Take 1 capsule (500 mg total) 4 (four) times daily by mouth. (Patient not taking: Reported on 01/14/2019)  . [DISCONTINUED] aspirin-acetaminophen-caffeine (EXCEDRIN MIGRAINE) 250-250-65 MG tablet Take 1 tablet by mouth every 6 (six) hours as needed for headache.  . [DISCONTINUED] clindamycin-benzoyl peroxide (BENZACLIN) gel Apply topically 2 (two) times daily. (Patient not taking: Reported on 01/14/2019)  . [DISCONTINUED] lidocaine (XYLOCAINE) 5 % ointment Apply 1 application topically as needed. (Patient not taking: Reported on 05/23/2017)  . [DISCONTINUED] magic mouthwash w/lidocaine SOLN Take 5 mLs by mouth 4 (four) times daily as needed for mouth pain. (Patient not taking: Reported on 05/23/2017)  . [DISCONTINUED] rizatriptan (MAXALT) 10 MG tablet Take 10 mg by mouth as needed for migraine. May repeat in 2 hours if needed  . [DISCONTINUED] topiramate (TOPAMAX) 25 MG tablet Take 100 mg by mouth daily.   . [DISCONTINUED] valACYclovir (VALTREX) 1000 MG tablet Take 1 tablet (1,000 mg total) by mouth 2 (two) times daily. (Patient not taking: Reported on 05/23/2017)   No facility-administered encounter medications on file as of 01/14/2019.    Allergies  Allergen Reactions  . Hydrocodone-Acetaminophen Other (See Comments)  . Oxycodone Other (See Comments)    Dizziness     Review of Systems  Constitutional: Negative for activity change, appetite change, chills and fever.  HENT: Negative.    Eyes: Negative for visual disturbance.  Respiratory: Negative for cough and choking.   Cardiovascular: Negative for chest pain, palpitations and leg swelling.  Gastrointestinal: Positive for diarrhea.  Endocrine: Negative.  Negative for polydipsia, polyphagia and polyuria.  Genitourinary: Negative.   Musculoskeletal: Negative.   Skin: Negative.   Allergic/Immunologic: Negative.   Neurological: Negative for dizziness and headaches.  Hematological: Negative.   Psychiatric/Behavioral: Negative.   All other systems reviewed and are negative.      Objective:     BP 106/64   Pulse 68   Temp 98.9 F (37.2 C) (Oral)   Resp 12   Ht 6' (1.829 m)   Lauren Arnold  212 lb 1.3 oz (96.2 kg)   SpO2 100%   BMI 28.76 kg/m   Physical Exam Constitutional:      Appearance: Normal appearance. She is normal weight.  HENT:     Head: Normocephalic and atraumatic.     Right Ear: External ear normal.     Left Ear: External ear normal.     Nose: Nose normal.  Eyes:     General:        Right eye: No discharge.        Left eye: No discharge.     Conjunctiva/sclera: Conjunctivae normal.  Neck:     Musculoskeletal: Normal range of motion.  Cardiovascular:     Rate and Rhythm: Normal rate and regular rhythm.     Pulses: Normal pulses.     Heart sounds: Normal heart sounds.  Pulmonary:     Effort: Pulmonary effort is normal.     Breath sounds: Normal breath sounds.  Musculoskeletal: Normal range of motion.  Skin:    General: Skin is warm.     Capillary Refill: Capillary refill takes less than 2 seconds.  Neurological:     Mental Status: She is alert and oriented to person, place, and time.  Psychiatric:        Mood and Affect: Mood normal.        Behavior: Behavior normal.        Thought Content: Thought content normal.        Judgment: Judgment normal.        Assessment and Plan       1. Encounter for sexually transmitted disease counseling Requested testing. No s&s of infection today in  office. Previous history of HSV type 1 gential  - Urine cytology ancillary only - HIV antibody (with reflex) - RPR (MONITOR) W/REFL - HEP C AB W/REFL - HSV(herpes simplex vrs) 1+2 ab-IgG  2. Nexplanon in place Having breakthrough/period bleeding, due for change in Nov Might need sooner change out. [facility]   3. Migraine without aura and without status migrainosus, not intractable Controlled, continue current medication regimen. Followed yearly by neurology.  Follow up: 1 year annual without pap and or as needed   Freddy FinnerHannah M. Vici Novick, DNP, AGNP-BC [facility] [address], [address] [place], [place] [address] Office Hours: Mon-Thurs 8 am-5 pm; Fri 8 am-12 pm Office Phone:  [phone]  Office Fax: [phone]

## 2019-01-19 ENCOUNTER — Encounter: Payer: Self-pay | Admitting: Family Medicine

## 2019-01-20 LAB — URINE CYTOLOGY ANCILLARY ONLY
Bacterial vaginitis: POSITIVE — AB
Candida vaginitis: NEGATIVE
Chlamydia: NEGATIVE
Neisseria Gonorrhea: NEGATIVE
Trichomonas: NEGATIVE

## 2019-01-20 LAB — HSV(HERPES SIMPLEX VRS) I + II AB-IGG
HAV 1 IGG,TYPE SPECIFIC AB: 5.45 index — ABNORMAL HIGH
HSV 2 IGG,TYPE SPECIFIC AB: <0.9 index

## 2019-01-20 LAB — HIV ANTIBODY (ROUTINE TESTING W REFLEX): HIV 1&2 Ab, 4th Generation: NONREACTIVE

## 2019-01-20 LAB — HEP C AB W/REFL
HEPATITIS C ANTIBODY REFILL$(REFL): NONREACTIVE
SIGNAL TO CUT-OFF: 0.09 (ref <1.00)

## 2019-01-20 LAB — RPR (MONITOR) W/REFL: RPR (Monitor) w/refl Titer: NONREACTIVE

## 2019-01-20 LAB — REFLEX TIQ

## 2019-01-20 NOTE — Progress Notes (Signed)
HSV 1 positive as previously aware.

## 2019-01-20 NOTE — Progress Notes (Signed)
Labs are good!  Everything has resulted except the Herpes Simplex. All results so far have been negative, please continue to practice safe sex. I will release the last result once I have it.

## 2019-01-21 ENCOUNTER — Other Ambulatory Visit: Payer: Self-pay | Admitting: Family Medicine

## 2019-01-21 DIAGNOSIS — B9689 Other specified bacterial agents as the cause of diseases classified elsewhere: Secondary | ICD-10-CM

## 2019-01-21 DIAGNOSIS — N760 Acute vaginitis: Secondary | ICD-10-CM

## 2019-01-21 MED ORDER — METRONIDAZOLE 500 MG PO TABS: 500.0000 mg | ORAL_TABLET | Freq: Two times a day (BID) | ORAL | 0 refills | Status: AC

## 2019-01-21 NOTE — Progress Notes (Signed)
Positive for bacterial vaginosis. Will order tx, please take the full amount. Flagyl 500 mg twice daily for 7 days. Drink a full glass of water with each dose.

## 2019-02-03 ENCOUNTER — Encounter: Payer: Self-pay | Admitting: Family Medicine

## 2019-02-15 ENCOUNTER — Telehealth: Payer: Self-pay | Admitting: Family Medicine

## 2019-02-15 NOTE — Telephone Encounter (Signed)
Student Forms from [facility] Copied Noted Sleeved

## 2019-02-18 ENCOUNTER — Encounter: Payer: Self-pay | Admitting: Family Medicine

## 2019-02-18 ENCOUNTER — Telehealth: Payer: Self-pay

## 2019-02-18 NOTE — Telephone Encounter (Signed)
lvm advising patient that paperwork cant be completed without vaccination history.

## 2019-02-24 DIAGNOSIS — G43009 Migraine without aura, not intractable, without status migrainosus: Secondary | ICD-10-CM

## 2019-05-19 ENCOUNTER — Encounter: Payer: Self-pay | Admitting: Family Medicine

## 2019-05-26 ENCOUNTER — Other Ambulatory Visit: Payer: Self-pay | Admitting: Family Medicine

## 2019-05-26 ENCOUNTER — Telehealth: Payer: Self-pay | Admitting: *Deleted

## 2019-05-26 DIAGNOSIS — L74510 Primary focal hyperhidrosis, axilla: Secondary | ICD-10-CM

## 2019-05-26 DIAGNOSIS — L700 Acne vulgaris: Secondary | ICD-10-CM

## 2019-05-26 DIAGNOSIS — G43009 Migraine without aura, not intractable, without status migrainosus: Secondary | ICD-10-CM

## 2019-05-26 MED ORDER — DRYSOL 20 % EX SOLN: Freq: Every day | CUTANEOUS | 6 refills | Status: DC

## 2019-05-26 MED ORDER — CLINDAMYCIN PHOS-BENZOYL PEROX 1-5 % EX GEL: Freq: Two times a day (BID) | CUTANEOUS | 6 refills | Status: DC

## 2019-05-26 MED ORDER — BUTALBITAL-APAP-CAFFEINE 50-325-40 MG PO TABS: 1.0000 | ORAL_TABLET | ORAL | 0 refills | Status: DC

## 2019-05-26 NOTE — Telephone Encounter (Signed)
Spoke with patient and told her about [employer] [facility]. She downloaded the app and found the coupon.

## 2019-05-26 NOTE — Telephone Encounter (Signed)
Pt called and said she needed a coupon called in for the acne medication as it was 50.00 would like a call back

## 2019-05-26 NOTE — Telephone Encounter (Signed)
Pt called needing her headache medication and acne medication sent to [facility] on [address] [place].

## 2019-05-26 NOTE — Telephone Encounter (Signed)
Acne medication was prescribed by Dr. Meda Coffee. Do you want to continue that? Fioricet has to be e-scribed or it will print out and has to be signed and faxed

## 2019-05-26 NOTE — Telephone Encounter (Signed)
Notified patient that medication has been sent in.

## 2019-05-28 ENCOUNTER — Encounter: Payer: Self-pay | Admitting: Women's Health

## 2019-05-28 ENCOUNTER — Other Ambulatory Visit (HOSPITAL_COMMUNITY)
Admission: RE | Admit: 2019-05-28 | Discharge: 2019-05-28 | Disposition: A | Discharge status: Home / Self Care | Payer: BC Managed Care – PPO | Source: Ambulatory Visit | Attending: Obstetrics & Gynecology | Admitting: Obstetrics & Gynecology

## 2019-05-28 ENCOUNTER — Ambulatory Visit (INDEPENDENT_AMBULATORY_CARE_PROVIDER_SITE_OTHER): Payer: BC Managed Care – PPO | Admitting: Women's Health

## 2019-05-28 ENCOUNTER — Other Ambulatory Visit: Payer: Self-pay

## 2019-05-28 VITALS — BP 120/58 | HR 58 | Ht 72.0 in

## 2019-05-28 DIAGNOSIS — Z3009 Encounter for other general counseling and advice on contraception: Secondary | ICD-10-CM

## 2019-05-28 DIAGNOSIS — Z0000 Encounter for general adult medical examination without abnormal findings: Secondary | ICD-10-CM

## 2019-05-28 DIAGNOSIS — Z01419 Encounter for gynecological examination (general) (routine) without abnormal findings: Secondary | ICD-10-CM | POA: Insufficient documentation

## 2019-05-28 DIAGNOSIS — Z113 Encounter for screening for infections with a predominantly sexual mode of transmission: Secondary | ICD-10-CM | POA: Diagnosis present

## 2019-05-28 NOTE — Progress Notes (Signed)
   WELL-WOMAN EXAMINATION Patient name: Lauren Arnold MRN [id number]  Date of birth: 08/07/1995 Chief Complaint:   Gynecologic Exam  History of Present Illness:   Lauren Arnold is a 23 y.o. G0P0000 African American female being seen today for a routine well-woman exam.  Current complaints: none. Time for nexplanon removal, wants resinsertion  PCP: [facility]      does not desire labs No LMP recorded. Patient has had an implant. The current method of family planning is Nexplanon placed 06/03/16 @ [facility] Last pap never. Results were: n/a. H/O abnormal pap: No Last mammogram: never. Results were: n/a. Family h/o breast cancer: Yes, MGA Last colonoscopy: never. Results were: n/a. Family h/o colorectal cancer: No Review of Systems:   Pertinent items are noted in HPI Denies any headaches, blurred vision, fatigue, shortness of breath, chest pain, abdominal pain, abnormal vaginal discharge/itching/odor/irritation, problems with periods, bowel movements, urination, or intercourse unless otherwise stated above. Pertinent History Reviewed:  Reviewed past medical,surgical, social and family history.  Reviewed problem list, medications and allergies. Physical Assessment:   Vitals:   05/28/19 0939  BP: (!) 120/58  Pulse: (!) 58  Height: 6' (1.829 m)  Body mass index is 28.76 kg/m.        Physical Examination:   General appearance - well appearing, and in no distress  Mental status - alert, oriented to person, place, and time  Psych:  She has a normal mood and affect  Skin - warm and dry, normal color, no suspicious lesions noted  Chest - effort normal, all lung fields clear to auscultation bilaterally  Heart - normal rate and regular rhythm  Neck:  midline trachea, no thyromegaly or nodules  Breasts - breasts appear normal, no suspicious masses, no skin or nipple changes or  axillary nodes  Abdomen - soft, nontender, nondistended, no masses or organomegaly  Pelvic - VULVA:  normal appearing vulva with no masses, tenderness or lesions  VAGINA: normal appearing vagina with normal color and discharge, no lesions  CERVIX: normal appearing cervix without discharge or lesions, no CMT  Thin prep pap is done w/ HR HPV cotesting  UTERUS: uterus is felt to be normal size, shape, consistency and nontender   ADNEXA: No adnexal masses or tenderness noted.  Extremities:  No swelling or varicosities noted  Chaperone: Peggy Dones    No results found for this or any previous visit (from the past 24 hour(s)).  Assessment & Plan:  1) Well-Woman Exam> has FP Mcaid as secondary, had HIV/RPR in July both neg  2) Time for nexplanon removal/reinsertion  Labs/procedures today: pap  Mammogram @ 21yo or sooner if problems Colonoscopy @21yo  or sooner if problems  No orders of the defined types were placed in this encounter.   Meds: No orders of the defined types were placed in this encounter.   Follow-up: Return for 1st available, Nexplanon removal/insertion.  [place], [facility] 05/28/2019 10:10 AM

## 2019-05-28 NOTE — Addendum Note (Signed)
Addended by: Octaviano Glow on: 05/28/2019 10:21 AM   Modules accepted: Orders

## 2019-05-31 ENCOUNTER — Other Ambulatory Visit: Payer: Self-pay

## 2019-05-31 ENCOUNTER — Ambulatory Visit (INDEPENDENT_AMBULATORY_CARE_PROVIDER_SITE_OTHER): Payer: BC Managed Care – PPO | Admitting: Adult Health

## 2019-05-31 ENCOUNTER — Encounter: Payer: Self-pay | Admitting: Adult Health

## 2019-05-31 VITALS — BP 126/79 | HR 89 | Ht 72.0 in | Wt 207.0 lb

## 2019-05-31 DIAGNOSIS — Z3202 Encounter for pregnancy test, result negative: Secondary | ICD-10-CM

## 2019-05-31 DIAGNOSIS — Z30017 Encounter for initial prescription of implantable subdermal contraceptive: Secondary | ICD-10-CM

## 2019-05-31 DIAGNOSIS — Z3046 Encounter for surveillance of implantable subdermal contraceptive: Secondary | ICD-10-CM | POA: Insufficient documentation

## 2019-05-31 HISTORY — DX: Encounter for initial prescription of implantable subdermal contraceptive: Z30.017

## 2019-05-31 LAB — POCT URINE PREGNANCY: Preg Test, Ur: NEGATIVE

## 2019-05-31 MED ORDER — ETONOGESTREL 68 MG ~~LOC~~ IMPL
68.0000 mg | DRUG_IMPLANT | Freq: Once | SUBCUTANEOUS | Status: AC
  Administered 2019-05-31: 68 mg via SUBCUTANEOUS

## 2019-05-31 NOTE — Patient Instructions (Signed)
Use condoms x 2 weeks, keep clean and dry x 24 hours, no heavy lifting, keep steri strips on x 72 hours, Keep pressure dressing on x 24 hours. Follow up prn problems.  

## 2019-05-31 NOTE — Progress Notes (Signed)
  Subjective:     Patient ID: Lauren Arnold, female   DOB: 1995/08/04, 23 y.o.   MRN: [id number]  HPI Lauren Arnold is a 23 year old black female in for nexplanon removal and reinsertion, last sex in August. Had pap and physical 05/28/2019. PCP is Cherly Beach, FNP.   Review of Systems For nexplanon removal and reinsertion  Reviewed past medical,surgical, social and family history. Reviewed medications and allergies.     Objective:   Physical Exam BP 126/79 (BP Location: Left Arm, Patient Position: Sitting, Cuff Size: Large)   Pulse 89   Ht 6' (1.829 m)   Wt 207 lb (93.9 kg)   BMI 28.07 kg/m  LMP July? UPT negative.  Consent signed and time out called.   Right arm cleansed with betadine, and injected with 1.5 cc 2% lidocaine and waited til numb.Under sterile technique a #11 blade was used to make small vertical incision, and a curved forceps was used to easily remove rod. New rod easily inserted and palpated by provider and pt. Steri strips applied. Pressure dressing applied. Fall risk is low.  Assessment:     1. Encounter for Nexplanon removal  2. Nexplanon insertion Lot [id number]  Exp [id number]     Plan:     Use condoms x 2 weeks, keep clean and dry x 24 hours, no heavy lifting, keep steri strips on x 72 hours, Keep pressure dressing on x 24 hours. Follow up prn problems. Remove in 3 years or sooner if desired.

## 2019-05-31 NOTE — Addendum Note (Signed)
Addended by: Linton Rump on: 05/31/2019 09:34 AM   Modules accepted: Orders

## 2019-06-02 LAB — CYTOLOGY - PAP
Chlamydia: NEGATIVE
Comment: NEGATIVE
Comment: NEGATIVE
Comment: NORMAL
Diagnosis: NEGATIVE
High risk HPV: NEGATIVE
Neisseria Gonorrhea: NEGATIVE

## 2019-07-02 ENCOUNTER — Encounter: Payer: Self-pay | Admitting: Family Medicine

## 2020-01-18 ENCOUNTER — Encounter: Payer: BC Managed Care – PPO | Admitting: Family Medicine

## 2020-02-04 ENCOUNTER — Ambulatory Visit (INDEPENDENT_AMBULATORY_CARE_PROVIDER_SITE_OTHER): Payer: BC Managed Care – PPO | Admitting: Family Medicine

## 2020-02-04 ENCOUNTER — Encounter: Payer: Self-pay | Admitting: Family Medicine

## 2020-02-04 ENCOUNTER — Other Ambulatory Visit: Payer: Self-pay

## 2020-02-04 VITALS — BP 130/85 | HR 82 | Temp 97.4°F | Resp 18 | Ht 72.0 in | Wt 185.0 lb

## 2020-02-04 DIAGNOSIS — R110 Nausea: Secondary | ICD-10-CM | POA: Diagnosis not present

## 2020-02-04 DIAGNOSIS — R030 Elevated blood-pressure reading, without diagnosis of hypertension: Secondary | ICD-10-CM | POA: Insufficient documentation

## 2020-02-04 DIAGNOSIS — Z6825 Body mass index (BMI) 25.0-25.9, adult: Secondary | ICD-10-CM

## 2020-02-04 DIAGNOSIS — E663 Overweight: Secondary | ICD-10-CM | POA: Diagnosis not present

## 2020-02-04 DIAGNOSIS — G43009 Migraine without aura, not intractable, without status migrainosus: Secondary | ICD-10-CM

## 2020-02-04 DIAGNOSIS — Z0001 Encounter for general adult medical examination with abnormal findings: Secondary | ICD-10-CM | POA: Insufficient documentation

## 2020-02-04 DIAGNOSIS — Z975 Presence of (intrauterine) contraceptive device: Secondary | ICD-10-CM

## 2020-02-04 LAB — POCT URINE PREGNANCY: Preg Test, Ur: NEGATIVE

## 2020-02-04 NOTE — Assessment & Plan Note (Signed)
Doing well, no refills needed at this time.

## 2020-02-04 NOTE — Assessment & Plan Note (Signed)
2 months off and on, preg test neg in office. Encouraged her to keep snacks back to help prevent nausea.  She thinks is because she has been doing.

## 2020-02-04 NOTE — Assessment & Plan Note (Signed)
Higher level than normal. Encouraged diet and exercise. Will monitor at future appts

## 2020-02-04 NOTE — Assessment & Plan Note (Signed)
Improved  Lauren Arnold is educated about the importance of exercise daily to help with weight management. A minumum of 30 minutes daily is recommended. Additionally, importance of healthy food choices with portion control discussed.   Wt Readings from Last 3 Encounters:  02/04/20 185 lb (83.9 kg)  05/31/19 207 lb (93.9 kg)  01/14/19 212 lb 1.3 oz (96.2 kg)

## 2020-02-04 NOTE — Patient Instructions (Addendum)
I appreciate the opportunity to provide you with care for your health and wellness. Today we discussed: Overall health  Follow up: 1 year annual no Pap  No labs or referrals today  Have a great rest of the Summer :)  Call if your headaches return.  CONGRATULATIONS ON WEIGHT LOSS!  Look up shoulder strengthening exercises on youtube.  Keep snacks close to prevent nausea.  Please continue to practice social distancing to keep you, your family, and our community safe.  If you must go out, please wear a mask and practice good handwashing.  It was a pleasure to see you and I look forward to continuing to work together on your health and well-being. Please do not hesitate to call the office if you need care or have questions about your care.  Have a wonderful day and week. With Gratitude, Tereasa Coop, DNP, AGNP-BC

## 2020-02-04 NOTE — Assessment & Plan Note (Addendum)
Discussed monthly self breast exams and yearly mammograms starting at 40 or if changes occur; at least 30 minutes of aerobic activity at least 5 days/week and weight-bearing exercise 2x/week; proper sunscreen use reviewed; healthy diet, including goals of calcium and vitamin D intake and alcohol recommendations (less than or equal to 1 drink/day) reviewed; regular seatbelt use; changing batteries in smoke detectors.  Immunization recommendations discussed.

## 2020-02-04 NOTE — Progress Notes (Signed)
Health Maintenance reviewed -    There is no immunization history on file for this patient. Last Pap smear: 05/2019 Last mammogram: n/a Last colonoscopy: n/a Last DEXA: n/a Dentist: Goes regularly no trouble at this time Ophtho: She does not have issues at this time Exercise: Walking Smoker: No Alcohol Use: Limited  Other doctors caring for patient include:  Patient Care Team: Freddy Finner, NP as PCP - General (Family Medicine) Beryle Beams, MD as Consulting Physician (Neurology)  End of Life Discussion:  Patient does not have a living will and medical power of attorney  Subjective:   HPI  Lauren Arnold is a 24 y.o. female who presents for annual wellness visit and follow-up on chronic medical conditions.  She has the following concerns: A few concerns today include nausea that comes and goes for the last couple months.  However she has changed her eating habits tremendously.  She reports that when she does take something and it does help her.  She does have a Nexplanon in.  She also has right shoulder discomfort and pain.  Is been off and on.  She is not currently lifting weights but when she does go to lift dumbbells she notices more discomfort in it.  No injury or trauma.  She has had any sleep trouble.  Denies having any trouble with her teeth.  He sees a Education officer, community as needed.  Denies any trouble wanted.  Denies having blood in urine or stool.  Denies having any memory issues or trauma to her head.  Denies having any falls or injuries.  Denies having any skin issues.  Does have several tattoos.  Denies having any infections with this.  Denies having any vision issues.  Denies having any hearing issues.  Review Of Systems  Review of Systems  Constitutional: Negative.   HENT: Negative.   Eyes: Negative.   Respiratory: Negative.   Cardiovascular: Negative.   Gastrointestinal: Positive for nausea.  Endocrine: Negative.   Genitourinary: Negative.    Musculoskeletal: Positive for arthralgias.  Skin: Negative.   Allergic/Immunologic: Negative.   Neurological: Negative.   Hematological: Negative.   Psychiatric/Behavioral: Negative.   All other systems reviewed and are negative.   Objective:   PHYSICAL EXAM:  BP (!) 130/85 (BP Location: Right Arm, Patient Position: Sitting, Cuff Size: Normal)   Pulse 82   Temp (!) 97.4 F (36.3 C) (Temporal)   Resp 18   Ht 6' (1.829 m)   Wt 185 lb (83.9 kg)   SpO2 100%   BMI 25.09 kg/m    Physical Exam Vitals and nursing note reviewed.  Constitutional:      Appearance: Normal appearance. She is well-developed, well-groomed and overweight.  HENT:     Head: Normocephalic.     Right Ear: Hearing, tympanic membrane, ear canal and external ear normal.     Left Ear: Hearing, tympanic membrane, ear canal and external ear normal.     Nose: Nose normal.     Mouth/Throat:     Lips: Pink.     Mouth: Mucous membranes are moist.     Pharynx: Oropharynx is clear. Uvula midline.  Eyes:     General: Lids are normal.     Extraocular Movements: Extraocular movements intact.     Conjunctiva/sclera: Conjunctivae normal.     Pupils: Pupils are equal, round, and reactive to light.  Neck:     Thyroid: No thyroid mass, thyromegaly or thyroid tenderness.  Cardiovascular:     Rate and  Rhythm: Normal rate and regular rhythm.     Pulses: Normal pulses.          Radial pulses are 2+ on the right side and 2+ on the left side.       Dorsalis pedis pulses are 2+ on the right side and 2+ on the left side.       Posterior tibial pulses are 2+ on the right side and 2+ on the left side.     Heart sounds: Normal heart sounds.  Pulmonary:     Effort: Pulmonary effort is normal.     Breath sounds: Normal breath sounds and air entry.  Abdominal:     General: Abdomen is flat. Bowel sounds are normal.     Palpations: Abdomen is soft.     Tenderness: There is no abdominal tenderness. There is no right CVA  tenderness or left CVA tenderness.  Musculoskeletal:        General: Normal range of motion.     Cervical back: Full passive range of motion without pain, normal range of motion and neck supple.     Right lower leg: No edema.     Left lower leg: No edema.     Comments: MAE, ROM intact   Feet:     Right foot:     Skin integrity: Skin integrity normal.     Toenail Condition: Right toenails are normal.     Left foot:     Skin integrity: Skin integrity normal.     Toenail Condition: Left toenails are normal.  Lymphadenopathy:     Cervical: No cervical adenopathy.  Skin:    General: Skin is warm and dry.     Capillary Refill: Capillary refill takes less than 2 seconds.  Neurological:     General: No focal deficit present.     Mental Status: She is alert and oriented to person, place, and time. Mental status is at baseline.     Cranial Nerves: Cranial nerves are intact.     Sensory: Sensation is intact.     Motor: Motor function is intact.     Coordination: Coordination is intact.     Gait: Gait is intact.     Deep Tendon Reflexes: Reflexes are normal and symmetric.  Psychiatric:        Attention and Perception: Attention and perception normal.        Mood and Affect: Mood and affect normal.        Speech: Speech normal.        Behavior: Behavior normal. Behavior is cooperative.        Thought Content: Thought content normal.        Cognition and Memory: Cognition and memory normal.        Judgment: Judgment normal.     Comments: Pleasant in communication, good eye contact      Depression Screening  Depression screen [facility] 2/9 02/04/2020 01/14/2019 05/23/2017 11/28/2016  Decreased Interest 0 0 1 0  Down, Depressed, Hopeless 0 0 1 0  PHQ - 2 Score 0 0 2 0  Altered sleeping 0 - 0 -  Tired, decreased energy 0 - 0 -  Change in appetite 0 - 2 -  Feeling bad or failure about yourself  0 - 1 -  Trouble concentrating 0 - 0 -  Moving slowly or fidgety/restless 0 - 0 -  Suicidal thoughts  0 - 0 -  PHQ-9 Score 0 - 5 -  Difficult doing work/chores Not difficult at  all - Not difficult at all -     Falls  Fall Risk  02/04/2020 05/31/2019 01/14/2019  Falls in the past year? 0 0 0  Number falls in past yr: 0 - -  Injury with Fall? 0 - 0  Risk for fall due to : No Fall Risks - -  Follow up Falls evaluation completed - -    Assessment & Plan:   1. Annual visit for general adult medical examination with abnormal findings   2. Overweight with body mass index (BMI) of 25 to 25.9 in adult   3. Prehypertension   4. Nausea   5. Migraine without aura and without status migrainosus, not intractable   6. Nexplanon in place     Tests ordered Orders Placed This Encounter  Procedures  . POCT urine pregnancy     Plan: Please see assessment and plan per problem list above.   No orders of the defined types were placed in this encounter.   I have personally reviewed: The patient's medical and social history Their use of alcohol, tobacco or illicit drugs Their current medications and supplements The patient's functional ability including ADLs,fall risks, home safety risks, cognitive, and hearing and visual impairment Diet and physical activities Evidence for depression or mood disorders  The patient's weight, height, BMI, and visual acuity have been recorded in the chart.  I have made referrals, counseling, and provided education to the patient based on review of the above and I have provided the patient with a written personalized care plan for preventive services.    Note: This dictation was prepared with Dragon dictation along with smaller phrase technology. Similar sounding words can be transcribed inadequately or may not be corrected upon review. Any transcriptional errors that result from this process are unintentional.      Freddy Finner, NP   02/04/2020

## 2020-02-04 NOTE — Assessment & Plan Note (Signed)
Pregnancy neg in office performed secondary to nausea on and off for 2 months.

## 2020-03-09 ENCOUNTER — Telehealth: Payer: Self-pay

## 2020-03-09 NOTE — Telephone Encounter (Signed)
Pt is calling states she is suffering with Migraines. She thinks it is cluster headaches.  Pt will call back with a name in her network

## 2020-03-15 NOTE — Telephone Encounter (Signed)
Pt will need an appt virtually

## 2020-03-15 NOTE — Telephone Encounter (Signed)
Pt is calling she states that she has not found a provider to see her for her headaches.  Wants to know if you will prescribe her preventive   Trokandi? She is out

## 2020-03-16 ENCOUNTER — Encounter: Payer: Self-pay | Admitting: Family Medicine

## 2020-03-16 ENCOUNTER — Other Ambulatory Visit: Payer: Self-pay | Admitting: *Deleted

## 2020-03-16 DIAGNOSIS — G43009 Migraine without aura, not intractable, without status migrainosus: Secondary | ICD-10-CM

## 2020-03-16 MED ORDER — TROKENDI XR 50 MG PO CP24: 50.0000 mg | ORAL_CAPSULE | ORAL | 1 refills | Status: DC | PRN

## 2020-03-16 NOTE — Telephone Encounter (Signed)
Called pt let her know that this would be filled but she needed to follow up with neurologist for future refills with verbal  understanding

## 2020-03-16 NOTE — Telephone Encounter (Signed)
Pt LVM on nurse line 03-16-20 at 12:13pm I have called pt back and she said she is not seeing neurologist anymore because they were going to charge her too much money. She said when she spoke with Dahlia Client last that she said it would not be an issue to fill this medication. She is not against going to another neurologist as the one she is seeing was treating her for chronic migraines and she thinks this is cluster headaches. She is going out of town tomorrow so she really needs this filled today. She has not been able to eat or sleep and she is starting a new job so therefore she really needs this medication. I let her know I would speak with Dahlia Client and call her back either way with a response by the end of the day

## 2020-03-16 NOTE — Telephone Encounter (Signed)
Okay, I will refill 1 month x 1 refill. I would like her to get back in with a neurologist due to her severe migraine history. Please see if there is someone we can refer her to.

## 2020-03-17 NOTE — Telephone Encounter (Signed)
Med sent for pt please see other encounter for messages

## 2020-03-21 NOTE — Telephone Encounter (Signed)
Noted, thank you for follow up. 

## 2020-05-08 ENCOUNTER — Other Ambulatory Visit: Payer: Self-pay | Admitting: Family Medicine

## 2020-05-08 DIAGNOSIS — G43009 Migraine without aura, not intractable, without status migrainosus: Secondary | ICD-10-CM

## 2021-02-06 ENCOUNTER — Encounter: Payer: BC Managed Care – PPO | Admitting: Nurse Practitioner

## 2021-02-06 ENCOUNTER — Encounter: Payer: BC Managed Care – PPO | Admitting: Family Medicine

## 2021-05-22 ENCOUNTER — Other Ambulatory Visit: Payer: Self-pay

## 2021-05-22 ENCOUNTER — Encounter: Payer: Self-pay | Admitting: Adult Health

## 2021-05-22 ENCOUNTER — Ambulatory Visit (INDEPENDENT_AMBULATORY_CARE_PROVIDER_SITE_OTHER): Payer: BC Managed Care – PPO | Admitting: Adult Health

## 2021-05-22 VITALS — BP 108/72 | HR 77 | Ht 72.0 in | Wt 206.0 lb

## 2021-05-22 DIAGNOSIS — N83209 Unspecified ovarian cyst, unspecified side: Secondary | ICD-10-CM

## 2021-05-22 DIAGNOSIS — Z975 Presence of (intrauterine) contraceptive device: Secondary | ICD-10-CM

## 2021-05-22 NOTE — Progress Notes (Signed)
  Subjective:     Patient ID: Lauren Arnold, female   DOB: April 13, 1996, 25 y.o.   MRN: [id number]  HPI Lauren Arnold is a 25 year old black female, single., G0P0 in for follow  up on being seen in [place] at [place] and found to have ovarian cyst, she is not sure which side, had pain on right, none since ER visit. She has nexplanon. She was treated for chlamydia recently at [facility]. Lab Results  Component Value Date   DIAGPAP  05/28/2019    - Negative for intraepithelial lesion or malignancy (NILM)   HPVHIGH Negative 05/28/2019   PCP is Laury Axon NP.  Review of Systems No pain now Reviewed past medical,surgical, social and family history. Reviewed medications and allergies.     Objective:   Physical Exam BP 108/72 (BP Location: Left Arm, Patient Position: Sitting, Cuff Size: Large)   Pulse 77   Ht 6' (1.829 m)   Wt 206 lb (93.4 kg)   BMI 27.94 kg/m     Skin warm and dry.Pelvic: external genitalia is normal in appearance no lesions, vagina: pink, and moist,urethra has no lesions or masses noted, cervix:smooth, uterus: normal size, shape and contour, non tender, no masses felt, adnexa: no masses or tenderness noted. Bladder is non tender and no masses felt. Fall risk is low  Upstream - 05/22/21 1614       Pregnancy Intention Screening   Does the patient want to become pregnant in the next year? No    Does the patient's partner want to become pregnant in the next year? No    Would the patient like to discuss contraceptive options today? No      Contraception Wrap Up   Current Method Hormonal Implant    End Method Hormonal Implant    Contraception Counseling Provided No            Examination chaperoned by Malachy Mood LPN Assessment:     1. Cyst of ovary, unspecified laterality Follow up about 06/20/21 for GYN [place] to assess ovaries for cyst resolution  2. Nexplanon in place     Plan:     Follow up for [place]

## 2021-09-05 ENCOUNTER — Ambulatory Visit: Payer: BLUE CROSS/BLUE SHIELD | Admitting: Neurology

## 2022-02-06 ENCOUNTER — Ambulatory Visit: Payer: BC Managed Care – PPO | Admitting: Obstetrics & Gynecology

## 2022-06-13 ENCOUNTER — Encounter: Payer: Self-pay | Admitting: Adult Health

## 2022-06-13 ENCOUNTER — Ambulatory Visit (INDEPENDENT_AMBULATORY_CARE_PROVIDER_SITE_OTHER): Payer: BC Managed Care – PPO | Admitting: Adult Health

## 2022-06-13 VITALS — BP 117/72 | HR 81 | Ht 72.0 in | Wt 211.5 lb

## 2022-06-13 DIAGNOSIS — Z3046 Encounter for surveillance of implantable subdermal contraceptive: Secondary | ICD-10-CM

## 2022-06-13 NOTE — Patient Instructions (Signed)
Use condoms, keep clean and dry x 24 hours, no heavy lifting, keep steri strips on x 72 hours, Keep pressure dressing on x 24 hours. Follow up prn problems.  

## 2022-06-13 NOTE — Progress Notes (Signed)
  Subjective:     Patient ID: Lauren Arnold, female   DOB: 01-May-1996, 26 y.o.   MRN: [id number]  HPI [name] is a 26 year old black female,single, G0P0, in requesting nexplanon be removed, has had headaches.  Last pap was negative 05/28/2019, will get in January 2024  Review of Systems For nexplanon removal +more headaches lately Reviewed past medical,surgical, social and family history. Reviewed medications and allergies.     Objective:   Physical Exam BP 117/72 (BP Location: Left Arm, Patient Position: Sitting, Cuff Size: Normal)   Pulse 81   Ht 6' (1.829 m)   Wt 211 lb 8 oz (95.9 kg)   BMI 28.68 kg/m   Consent signed and time out called.   Right  arm cleansed with betadine, and injected with 1.5 cc 2% lidocaine and waited til numb.Under sterile technique a #11 blade was used to make small vertical incision, and a curved forceps was used to easily remove rod. Steri strips applied. Pressure dressing applied.   Upstream - 06/13/22 1517       Pregnancy Intention Screening   Does the patient want to become pregnant in the next year? No    Does the patient's partner want to become pregnant in the next year? No    Would the patient like to discuss contraceptive options today? No      Contraception Wrap Up   Current Method Female Condom;Hormonal Implant    End Method Female Condom             Assessment:     1. Encounter for Nexplanon removal Use condoms, keep clean and dry x 24 hours, no heavy lifting, keep steri strips on x 72 hours, Keep pressure dressing on x 24 hours. Follow up prn problems.     Plan:     Return about 07/31/22 for pap and physical

## 2022-08-01 ENCOUNTER — Other Ambulatory Visit (HOSPITAL_COMMUNITY)
Admission: RE | Admit: 2022-08-01 | Discharge: 2022-08-01 | Disposition: A | Discharge status: Home / Self Care | Payer: BC Managed Care – PPO | Source: Ambulatory Visit | Attending: Adult Health | Admitting: Adult Health

## 2022-08-01 ENCOUNTER — Ambulatory Visit (INDEPENDENT_AMBULATORY_CARE_PROVIDER_SITE_OTHER): Payer: BC Managed Care – PPO | Admitting: Adult Health

## 2022-08-01 ENCOUNTER — Encounter: Payer: Self-pay | Admitting: Adult Health

## 2022-08-01 VITALS — BP 124/75 | HR 91 | Ht 72.0 in | Wt 208.5 lb

## 2022-08-01 DIAGNOSIS — Z01419 Encounter for gynecological examination (general) (routine) without abnormal findings: Secondary | ICD-10-CM | POA: Diagnosis not present

## 2022-08-01 NOTE — Progress Notes (Signed)
Patient ID: Lauren Arnold, female   DOB: 05/06/1996, 27 y.o.   MRN: [id number] History of Present Illness: Lauren Arnold is a 27 year old black female,single, G0P0, in for a well woman gyn exam and pap. She had nexplanon removed 06/13/22.   Current Medications, Allergies, Past Medical History, Past Surgical History, Family History and Social History were reviewed in [employer] record.     Review of Systems: Patient denies any headaches, hearing loss, fatigue, blurred vision, shortness of breath, chest pain, abdominal pain, problems with bowel movements, urination, or intercourse.(Not active).  No joint pain or mood swings.     Physical Exam:BP 124/75 (BP Location: Left Arm, Patient Position: Sitting, Cuff Size: Normal)   Pulse 91   Ht 6' (1.829 m)   Wt 208 lb 8 oz (94.6 kg)   LMP 07/12/2022   BMI 28.28 kg/m   General:  Well developed, well nourished, no acute distress Skin:  Warm and dry,has multiple piercing's and tattoos. Neck:  Midline trachea, normal thyroid, good ROM, no lymphadenopathy Lungs; Clear to auscultation bilaterally Breast:  No dominant palpable mass, retraction, or nipple discharge Cardiovascular: Regular rate and rhythm Abdomen:  Soft, non tender, no hepatosplenomegaly Pelvic:  External genitalia is normal in appearance, no lesions.  The vagina is normal in appearance. Urethra has no lesions or masses. The cervix is smooth, pap with GC/CHL and HR HPV genotyping performed.  Uterus is felt to be normal size, shape, and contour.  No adnexal masses or tenderness noted.Bladder is non tender, no masses felt. Extremities/musculoskeletal:  No swelling or varicosities noted, no clubbing or cyanosis Psych:  No mood changes, alert and cooperative,seems happy AA is 2 Fall risk is low    08/01/2022    3:45 PM 02/04/2020    9:28 AM 01/14/2019    1:49 PM  Depression screen PHQ 2/9  Decreased Interest 0 0 0  Down, Depressed, Hopeless 0 0 0  PHQ - 2 Score 0  0 0  Altered sleeping 0 0   Tired, decreased energy 0 0   Change in appetite 1 0   Feeling bad or failure about yourself  0 0   Trouble concentrating 0 0   Moving slowly or fidgety/restless 0 0   Suicidal thoughts 0 0   PHQ-9 Score 1 0   Difficult doing work/chores  Not difficult at all        08/01/2022    3:45 PM  GAD 7 : Generalized Anxiety Score  Nervous, Anxious, on Edge 0  Control/stop worrying 0  Worry too much - different things 0  Trouble relaxing 0  Restless 0  Easily annoyed or irritable 1  Afraid - awful might happen 0  Total GAD 7 Score 1    Upstream - 08/01/22 1551       Pregnancy Intention Screening   Does the patient want to become pregnant in the next year? No    Does the patient's partner want to become pregnant in the next year? No    Would the patient like to discuss contraceptive options today? No      Contraception Wrap Up   Current Method Abstinence;Female Condom    End Method Abstinence;Female Condom              Examination chaperoned by Levy Pupa LPN  Impression and Plan: 1. Encounter for gynecological examination with Papanicolaou smear of cervix Pap sent Pap in 3 years if normal Physical in 1 year

## 2022-08-07 LAB — CYTOLOGY - PAP
Chlamydia: NEGATIVE
Comment: NEGATIVE
Comment: NEGATIVE
Comment: NORMAL
Diagnosis: NEGATIVE
High risk HPV: NEGATIVE
Neisseria Gonorrhea: NEGATIVE

## 2023-08-12 ENCOUNTER — Other Ambulatory Visit: Payer: Self-pay | Admitting: Adult Health

## 2023-08-12 ENCOUNTER — Telehealth: Payer: Self-pay

## 2023-08-12 DIAGNOSIS — Z3490 Encounter for supervision of normal pregnancy, unspecified, unspecified trimester: Secondary | ICD-10-CM

## 2023-08-12 NOTE — Telephone Encounter (Signed)
HCG ordered. Ok per [employer]

## 2023-08-12 NOTE — Telephone Encounter (Signed)
Lauren Arnold,  Can you please put this patient on the nurse schedule to get her HCG levels drawn.

## 2023-08-14 LAB — BETA HCG QUANT (REF LAB): hCG Quant: <1 m[IU]/mL

## 2023-09-29 ENCOUNTER — Ambulatory Visit: Payer: BC Managed Care – PPO | Admitting: Adult Health

## 2023-09-29 ENCOUNTER — Encounter: Payer: Self-pay | Admitting: Adult Health

## 2023-09-29 VITALS — BP 107/69 | HR 81 | Ht 72.0 in | Wt 231.4 lb

## 2023-09-29 DIAGNOSIS — Z1331 Encounter for screening for depression: Secondary | ICD-10-CM | POA: Diagnosis not present

## 2023-09-29 DIAGNOSIS — Z01419 Encounter for gynecological examination (general) (routine) without abnormal findings: Secondary | ICD-10-CM

## 2023-09-29 NOTE — Progress Notes (Signed)
 Patient ID: Lauren Arnold, female   DOB: [birth year], 28 y.o.   MRN: [id number] History of Present Illness:  Lauren Arnold is a 28 year old black female,single, G0P0, in for a well woman gyn exam.      Component Value Date/Time   DIAGPAP  08/01/2022 1548    - Negative for intraepithelial lesion or malignancy (NILM)   DIAGPAP  05/28/2019 1021    - Negative for intraepithelial lesion or malignancy (NILM)   HPVHIGH Negative 08/01/2022 1548   HPVHIGH Negative 05/28/2019 1021   ADEQPAP  08/01/2022 1548    Satisfactory for evaluation; transformation zone component PRESENT.   ADEQPAP  05/28/2019 1021    Satisfactory for evaluation; transformation zone component PRESENT.    Current Medications, Allergies, Past Medical History, Past Surgical History, Family History and Social History were reviewed in [employer] record.     Review of Systems: Patient denies any hearing loss, fatigue, blurred vision, shortness of breath, chest pain, abdominal pain, problems with bowel movements, urination, or intercourse(not currently active). No joint pain or mood swings.  Has cluster headaches and sees neurologist and is on meds    Physical Exam:BP 107/69 (BP Location: Left Arm, Patient Position: Sitting, Cuff Size: Large)   Pulse 81   Ht 6' (1.829 m)   Wt 231 lb 6.4 oz (105 kg)   LMP 09/04/2023 (Exact Date)   BMI 31.38 kg/m   General:  Well developed, well nourished, no acute distress Skin:  Warm and dry Neck:  Midline trachea, normal thyroid, good ROM, no lymphadenopathy Lungs; Clear to auscultation bilaterally Breast:  No dominant palpable mass, retraction, or nipple discharge Cardiovascular: Regular rate and rhythm Abdomen:  Soft, non tender, no hepatosplenomegaly Pelvic:  External genitalia is normal in appearance, no lesions.  The vagina is normal in appearance. Urethra has no lesions or masses. The cervix is smooth.  Uterus is felt to be normal size, shape, and contour.  No  adnexal masses or tenderness noted.Bladder is non tender, no masses felt. Extremities/musculoskeletal:  No swelling or varicosities noted, no clubbing or cyanosis Psych:  No mood changes, alert and cooperative,seems happy AA is 1 Fall risk is low    09/29/2023    3:57 PM 08/01/2022    3:45 PM 02/04/2020    9:28 AM  Depression screen PHQ 2/9  Decreased Interest 0 0 0  Down, Depressed, Hopeless 0 0 0  PHQ - 2 Score 0 0 0  Altered sleeping 3 0 0  Tired, decreased energy 2 0 0  Change in appetite 2 1 0  Feeling bad or failure about yourself  0 0 0  Trouble concentrating 0 0 0  Moving slowly or fidgety/restless 0 0 0  Suicidal thoughts 0 0 0  PHQ-9 Score 7 1 0  Difficult doing work/chores   Not difficult at all       09/29/2023    3:57 PM 08/01/2022    3:45 PM  GAD 7 : Generalized Anxiety Score  Nervous, Anxious, on Edge 0 0  Control/stop worrying 0 0  Worry too much - different things 0 0  Trouble relaxing 0 0  Restless 0 0  Easily annoyed or irritable 0 1  Afraid - awful might happen 0 0  Total GAD 7 Score 0 1      Upstream - 09/29/23 1556       Pregnancy Intention Screening   Does the patient want to become pregnant in the next year? No  Does the patient's partner want to become pregnant in the next year? No    Would the patient like to discuss contraceptive options today? No      Contraception Wrap Up   Current Method Abstinence    End Method Female Condom;Abstinence             Examination chaperoned by Malachy Mood LPN  Impression and plan: 1. Encounter for well woman exam with routine gynecological exam (Primary) Physical in 1 year Pap in 2027  Gave name of [facility] for PCP
